# Patient Record
Sex: Male | Born: 1960
Health system: Southern US, Community
[De-identification: ages and names within clinical notes are randomized; demographics above are authoritative.]

## PROBLEM LIST (undated history)

## (undated) DIAGNOSIS — C61 Malignant neoplasm of prostate: Secondary | ICD-10-CM

## (undated) DIAGNOSIS — I639 Cerebral infarction, unspecified: Secondary | ICD-10-CM

## (undated) DIAGNOSIS — C679 Malignant neoplasm of bladder, unspecified: Secondary | ICD-10-CM

## (undated) DIAGNOSIS — Z8673 Personal history of transient ischemic attack (TIA), and cerebral infarction without residual deficits: Secondary | ICD-10-CM

## (undated) DIAGNOSIS — E119 Type 2 diabetes mellitus without complications: Secondary | ICD-10-CM

## (undated) DIAGNOSIS — Z720 Tobacco use: Secondary | ICD-10-CM

## (undated) DIAGNOSIS — I10 Essential (primary) hypertension: Secondary | ICD-10-CM

## (undated) HISTORY — PX: NO PAST SURGERIES: SHX2092

## (undated) HISTORY — DX: Personal history of transient ischemic attack (TIA), and cerebral infarction without residual deficits: Z86.73

## (undated) HISTORY — DX: Cerebral infarction, unspecified: I63.9

## (undated) HISTORY — DX: Malignant neoplasm of bladder, unspecified: C67.9

## (undated) HISTORY — DX: Type 2 diabetes mellitus without complications: E11.9

## (undated) HISTORY — DX: Essential (primary) hypertension: I10

## (undated) HISTORY — DX: Malignant neoplasm of prostate: C61

---
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
## 2017-03-10 ENCOUNTER — Encounter: Payer: Self-pay | Admitting: *Deleted

## 2017-03-11 ENCOUNTER — Encounter: Payer: Self-pay | Admitting: Cardiology

## 2017-03-11 ENCOUNTER — Telehealth: Payer: Self-pay | Admitting: Cardiology

## 2017-03-11 ENCOUNTER — Ambulatory Visit (INDEPENDENT_AMBULATORY_CARE_PROVIDER_SITE_OTHER): Payer: BLUE CROSS/BLUE SHIELD | Admitting: Cardiology

## 2017-03-11 ENCOUNTER — Encounter (INDEPENDENT_AMBULATORY_CARE_PROVIDER_SITE_OTHER): Payer: Self-pay

## 2017-03-11 VITALS — BP 138/78 | HR 105 | Ht 64.0 in | Wt 195.4 lb

## 2017-03-11 DIAGNOSIS — I10 Essential (primary) hypertension: Secondary | ICD-10-CM | POA: Diagnosis not present

## 2017-03-11 DIAGNOSIS — R55 Syncope and collapse: Secondary | ICD-10-CM | POA: Diagnosis not present

## 2017-03-11 DIAGNOSIS — R42 Dizziness and giddiness: Secondary | ICD-10-CM

## 2017-03-11 DIAGNOSIS — E1165 Type 2 diabetes mellitus with hyperglycemia: Secondary | ICD-10-CM

## 2017-03-11 NOTE — Telephone Encounter (Signed)
ECHO Scheduled at [facility] on Mar 15, 2017 arrive at 145pm

## 2017-03-11 NOTE — Progress Notes (Signed)
Cardiology Office Note  Date: 03/11/2017   ID: LAKEITH CAREAGA, DOB 1961-05-04, MRN [id number]  PCP: Monico Blitz, MD  Consulting Cardiologist: Rozann Lesches, MD   Chief Complaint  Patient presents with  . Dizziness    History of Present Illness: Eugene Harding is a 56 y.o. male referred for cardiology consultation by Dr. Manuella Ghazi for the assessment of dizziness. He is here today with his brother. He tells me that back in August he suddenly began to experience dizziness, specifically a feeling of lightheadedness as if he might pass out, but he does not have syncope. He also states that he has lost sensory sensation in his left hand. He states that this feeling of lightheadedness never goes away, is not any worse when he stands up or changes positions. He states that his vision is also worse, things look blurrier than they had been before. Patient's blood pressure was apparently substantially elevated when he first developed these symptoms, now with medication adjustments per Dr. Manuella Ghazi, his blood pressure has come down. He also has diabetes which is coming under somewhat better control.  Patient was seen in September by Dr. Inda Merlin for ENT consultation and was not felt to have true vertigo from an inner ear etiology. He also reportedly underwent a brain MRI and [facility], we are requesting these results. Also requesting lab results from [facility].  I personally reviewed his ECG today which shows sinus tachycardia.  We went over his current medications. He reports compliance.  Orthostatic measurements were made in the office today. Supine blood pressure 118/70 with heart rate 100, seated blood pressure 120/80 with heart rate 113, standing blood pressure 150/82 with heart rate 131, and standing blood pressure after 3 minutes 130/72 with heart rate 119.  He states that if it were not for these persistent symptoms discussed above, he would feel well. He does not report  any chest pain, no specific palpitations, no orthopnea or PND, no fevers or chills.  Past Medical History:  Diagnosis Date  . Essential hypertension   . Type 2 diabetes mellitus (HCC)     No reported surgeries.  Current Outpatient Prescriptions  Medication Sig Dispense Refill  . amLODipine (NORVASC) 10 MG tablet Take 10 mg by mouth daily.    Marland Kitchen aspirin EC 81 MG tablet Take 81 mg by mouth daily.    . dapagliflozin propanediol (FARXIGA) 5 MG TABS tablet Take 5 mg by mouth daily.    Marland Kitchen glucose blood test strip 1 each by Other route as needed for other. Use as instructed    . lisinopril (PRINIVIL,ZESTRIL) 40 MG tablet Take 40 mg by mouth daily.    . metFORMIN (GLUCOPHAGE) 500 MG tablet Take 500 mg by mouth 2 (two) times daily with a meal.     . ondansetron (ZOFRAN) 4 MG tablet Take 4 mg by mouth every 8 (eight) hours as needed for nausea or vomiting.     No current facility-administered medications for this visit.    Allergies:  Patient has no known allergies.   Social History: The patient  reports that he has been smoking Cigarettes.  He has never used smokeless tobacco.   Family History: The patient's family history includes Heart disease in his brother.   ROS:  Please see the history of present illness. Otherwise, complete review of systems is positive for none.  All other systems are reviewed and negative.   Physical Exam: VS:  BP 138/78   Pulse Marland Kitchen)  105   Ht 5\' 4"  (1.626 m)   Wt 195 lb 6.4 oz (88.6 kg)   SpO2 98%   BMI 33.54 kg/m , BMI Body mass index is 33.54 kg/m.  Wt Readings from Last 3 Encounters:  03/11/17 195 lb 6.4 oz (88.6 kg)  02/18/17 198 lb (89.8 kg)    General: Obese male, appears comfortable at rest. HEENT: Conjunctiva and lids normal, oropharynx clear. Neck: Supple, no elevated JVP or carotid bruits, no thyromegaly. Lungs: Clear to auscultation, nonlabored breathing at rest. Cardiac: Regular rate and rhythm, no S3 or significant systolic murmur, no  pericardial rub. Abdomen: Soft, nontender, bowel sounds present. Extremities: No pitting edema, distal pulses 2+. Skin: Warm and dry. Musculoskeletal: No kyphosis. Neuropsychiatric: Alert and oriented x3, affect grossly appropriate.  ECG: There is no old tracing available for comparison.  Recent Labwork:  No recent lab work available for review.  Other Studies Reviewed Today:  Chest x-ray 01/31/2017 ([place]): Cardiomegaly without acute coronary pulmonary disease.  Assessment and Plan:  1. Persistent lightheadedness since onset in August. No vertigo symptoms, no exacerbation of symptoms with position change, no orthostatic blood pressure drop today. He also reports concurrent change in visual acuity and sensory loss in his left hand increasing the likelihood of a neurological etiology. He is under the impression that the brain MRI done at [facility] was normal however. We are requesting these results. He also has an elevated resting heart rate in sinus rhythm at baseline. Could be increased due to autonomic dysfunction with diabetes mellitus, but would also want to exclude undiagnosed cardiomyopathy. We are requesting lab work obtained by Dr. Manuella Ghazi. Presumably he has been assessed for anemia and thyroid disease, etc. We will obtain an echocardiogram to assess cardiac structure and function. Not clear that a cardiac monitor is needed at this point based on description of symptoms. I recommended that he seek neurological consultation with [facility] and he plans to get this done through [facility].  2.  Essential hypertension, reportedly with very high blood pressures documented in August, coming under better control with medication adjustments per Dr. Manuella Ghazi. He is currently on Norvasc and lisinopril.  3. Uncontrolled type 2 diabetes mellitus, now on Farxiga and Glucophage and following with Dr. Manuella Ghazi.  Current medicines were reviewed with the  patient today.   Orders Placed This Encounter  Procedures  . EKG 12-Lead  . ECHOCARDIOGRAM COMPLETE    Disposition: Call with test results.  Signed, Satira Sark, MD, [facility] 03/11/2017 1:58 PM    [place] [facility] [place], [place], [place] [address] Phone: [phone]; Fax: [phone]

## 2017-03-11 NOTE — Patient Instructions (Signed)

## 2017-03-15 ENCOUNTER — Ambulatory Visit (HOSPITAL_COMMUNITY)
Admission: RE | Admit: 2017-03-15 | Discharge: 2017-03-15 | Disposition: A | Discharge status: Home / Self Care | Payer: BLUE CROSS/BLUE SHIELD | Source: Ambulatory Visit | Attending: Cardiology | Admitting: Cardiology

## 2017-03-15 DIAGNOSIS — E119 Type 2 diabetes mellitus without complications: Secondary | ICD-10-CM | POA: Insufficient documentation

## 2017-03-15 DIAGNOSIS — Z720 Tobacco use: Secondary | ICD-10-CM | POA: Insufficient documentation

## 2017-03-15 DIAGNOSIS — I119 Hypertensive heart disease without heart failure: Secondary | ICD-10-CM | POA: Diagnosis present

## 2017-03-15 DIAGNOSIS — R55 Syncope and collapse: Secondary | ICD-10-CM

## 2017-03-15 DIAGNOSIS — I080 Rheumatic disorders of both mitral and aortic valves: Secondary | ICD-10-CM | POA: Diagnosis not present

## 2017-03-15 NOTE — Progress Notes (Signed)
*  PRELIMINARY RESULTS* Echocardiogram 2D Echocardiogram has been performed.  Leavy Cella 03/15/2017, 2:49 PM

## 2017-03-16 ENCOUNTER — Telehealth: Payer: Self-pay

## 2017-03-16 NOTE — Telephone Encounter (Signed)
-----   Message from Merlene Laughter, LPN sent at [id number]/8616  7:17 AM EDT -----   ----- Message ----- From: Satira Sark, MD Sent: 03/15/2017   4:15 PM To: Merlene Laughter, LPN  Results reviewed. No evidence of cardiomyopathy, LVEF normal at 55-60%. No major valvular abnormalities. No further cardiac workup planned at this time - please see recent office note. A copy of this test should be forwarded to Monico Blitz, MD.

## 2017-03-16 NOTE — Telephone Encounter (Signed)
LMTCB

## 2017-03-16 NOTE — Telephone Encounter (Signed)
Patient returned call  Please call him back on house phone [phone]

## 2017-03-16 NOTE — Telephone Encounter (Signed)
Patient notified. Routed to PCP 

## 2017-03-25 ENCOUNTER — Encounter: Payer: Self-pay | Admitting: Neurology

## 2017-03-25 ENCOUNTER — Telehealth: Payer: Self-pay | Admitting: Neurology

## 2017-03-25 ENCOUNTER — Ambulatory Visit (INDEPENDENT_AMBULATORY_CARE_PROVIDER_SITE_OTHER): Payer: BLUE CROSS/BLUE SHIELD | Admitting: Neurology

## 2017-03-25 VITALS — BP 137/77 | HR 100 | Ht 64.0 in | Wt 194.0 lb

## 2017-03-25 DIAGNOSIS — I10 Essential (primary) hypertension: Secondary | ICD-10-CM

## 2017-03-25 DIAGNOSIS — R29898 Other symptoms and signs involving the musculoskeletal system: Secondary | ICD-10-CM | POA: Diagnosis not present

## 2017-03-25 DIAGNOSIS — I639 Cerebral infarction, unspecified: Secondary | ICD-10-CM

## 2017-03-25 DIAGNOSIS — R42 Dizziness and giddiness: Secondary | ICD-10-CM

## 2017-03-25 DIAGNOSIS — R200 Anesthesia of skin: Secondary | ICD-10-CM | POA: Diagnosis not present

## 2017-03-25 DIAGNOSIS — R29818 Other symptoms and signs involving the nervous system: Secondary | ICD-10-CM

## 2017-03-25 MED ORDER — ONDANSETRON 4 MG PO TBDP: 4.0000 mg | ORAL_TABLET | Freq: Three times a day (TID) | ORAL | 6 refills | Status: DC | PRN

## 2017-03-25 NOTE — Progress Notes (Addendum)
[facility]    Provider:  Dr Jaynee Eagles Referring Provider: Monico Blitz, MD Primary Care Physician:  Monico Blitz, MD  CC:  Dizziness  HPI:  Eugene Harding is a 56 y.o. male here as a referral from Dr. Manuella Ghazi for dizziness. This patient has not taken care of himself in years, is obese, no exercise, hasn't seen a doctor in years, smoker so did not discover he had HTN and diabetes until he had acute onset stroke.  The patient has uncontrolled hypertension, uncontrolled diabetes and a past medical history of vertigo. Here for dizziness, has been suddenly has been occurring in a persistent pattern for weeks. The courses been increasing, he also is a current smoker, obesity. He is on meclizine for dizziness. He had vertigo about 7 years ago. Started in August with a new diagnosis of diabetes, unsure how long he has been diabetic never went to a doctor. He started getting numb in his hand on the left. It was acute, end of August. Then he started getting dizzy, blurry in his eyes, he has dizziness, no room spinning, feels like he is going to pass out. Some days it has been worse. Nt better. He has not been to vestibular therapy. He has seen ENT. He has also seen. He went to urgent care and his BP was over 200. He went to [place], they did not admit him. That is when he was diagnosed with HTN and diabetes. He went to cardiology as well. No other focal neurologic deficits, associated symptoms, inciting events or modifiable factors.  Reviewed notes, labs and imaging from outside physicians, which showed:  Patient has uncontrolled hypertension and diabetes. Onset has been sudden and has been occurring in a persistent pattern for weeks. The courses been increasing. The dizziness is characterized as lightheadedness and off balance. Symptoms have been associated with diabetes, while the symptoms have not been associated with elevated blood pressure headache. He is taking meclizine. Has been to ENT.  Has already had an MRI.   Echocardiogram of the heart March 15 2017:  - Mild LVH with LVEF 55-60% and grade 1 diastolic dysfunction.   Mildly calcified mitral annulus with trivial mitral   regurgitation. Mildly calcified aortic valve. Trivial tricuspid   regurgitation.  MRI of the brain 03/12/2017 showed a left cerebellar infarct which is likely chronic.  CMP with increased glucose 280, hemoglobin A1c 8.8, BUN 17 and creatinine 0.86 collected 01/31/2017.  Review of Systems: Patient complains of symptoms per HPI as well as the following symptoms: Blurred vision, numbness, dizziness, vertigo 7 years ago, high blood pressure and "sugar".  Pertinent negatives and positives per HPI. All others negative.   Social History   Social History  . Marital status: Married    Spouse name: N/A  . Number of children: N/A  . Years of education: N/A   Occupational History  . Not on file.   Social History Main Topics  . Smoking status: Current Every Day Smoker    Packs/day: 1.00    Types: Cigarettes  . Smokeless tobacco: Never Used  . Alcohol use Yes     Comment: occaisonally  . Drug use: No  . Sexual activity: Not on file   Other Topics Concern  . Not on file   Social History Narrative  . No narrative on file    Family History  Problem Relation Age of Onset  . Heart disease Brother     Past Medical History:  Diagnosis Date  . Essential hypertension   .  Type 2 diabetes mellitus ([place])     Past Surgical History:  Procedure Laterality Date  . NO PAST SURGERIES      Current Outpatient Prescriptions  Medication Sig Dispense Refill  . amLODipine (NORVASC) 10 MG tablet Take 10 mg by mouth daily.    Marland Kitchen aspirin EC 325 MG EC tablet Take 1 tablet (325 mg total) by mouth daily. (Patient not taking: Reported on 04/02/2017) 30 tablet 1  . Blood Glucose Monitoring Suppl (ONETOUCH VERIO) w/Device KIT USE TO TEST BLOOD SUGAR  3  . dapagliflozin propanediol (FARXIGA) 5 MG TABS tablet  Take 5 mg by mouth daily.    Marland Kitchen glucose blood test strip 1 each by Other route as needed for other. Use as instructed    . lisinopril (PRINIVIL,ZESTRIL) 40 MG tablet Take 40 mg by mouth daily.    . metFORMIN (GLUCOPHAGE) 500 MG tablet Take 500 mg by mouth 2 (two) times daily with a meal.     . ONETOUCH DELICA LANCETS FINE MISC USE TO TEST BLOOD SUGAR ONCE DAILY  2  . aspirin 81 MG chewable tablet Chew 81 mg by mouth daily.    . ondansetron (ZOFRAN ODT) 4 MG disintegrating tablet Take 1 tablet (4 mg total) by mouth every 8 (eight) hours as needed for nausea or vomiting. Or dizziness. 60 tablet 6   No current facility-administered medications for this visit.     Allergies as of 03/25/2017  . (No Known Allergies)    Vitals: BP 137/77 (Patient Position: Supine)   Pulse 100   Ht 5' 4"  (1.626 m)   Wt 194 lb (88 kg)   BMI 33.30 kg/m  Last Weight:  Wt Readings from Last 1 Encounters:  03/25/17 194 lb (88 kg)   Last Height:   Ht Readings from Last 1 Encounters:  03/25/17 5' 4"  (1.626 m)   Physical exam: Exam: Gen: NAD, conversant, well nourised, obese, well groomed                     CV: RRR, no MRG. No Carotid Bruits. No peripheral edema, warm, nontender Eyes: Conjunctivae clear without exudates or hemorrhage  Neuro: Detailed Neurologic Exam  Speech:    Speech is normal; fluent and spontaneous with normal comprehension.  Cognition:    The patient is oriented to person, place, and time;     recent and remote memory intact;     language fluent;     normal attention, concentration,     fund of knowledge Cranial Nerves:    The pupils are equal, round, and reactive to light. Attempted funduscopic exam could not visualize Visual fields are full to finger confrontation. Extraocular movements are intact. Trigeminal sensation is intact and the muscles of mastication are normal. The face is symmetric. The palate elevates in the midline. Hearing intact. Voice is normal. Shoulder shrug is  normal. The tongue has normal motion without fasciculations.   Coordination:    Decreased fine motor movements left hand  Gait:    Slightly wide-based due to large body habitus  Motor Observation:    No asymmetry, no atrophy, and no involuntary movements noted. Tone:    Normal muscle tone.    Posture:    Posture is normal. normal erect    Strength:    normal     Sensation: intact to LT     Reflex Exam:  DTR's:    Deep tendon reflexes in the upper and lower extremities are symmetrical bilaterally.  Toes:    The toes are downgoing bilaterally.   Clonus:    Clonus is absent.       Assessment/Plan:  Acute onset left hand numbness, dizziness and vertigo likely small vessel stroke in a patient who is obese with uncontrolled diabetes, uncontrolled hypertension, head never been to a doctor.   - This patient has not taken care of himself, is obese, no exercise, smoker, hasn't seen a doctor in years so did not discover he had uncontrolled HTN and uncontrolled diabetes until he had acute onset neurologic symptoms a few months ago  He says he is not getting anywhere with doctors helping him (despite excellent treatment from the medical community); I did discuss taking personal responsibility for his own health and that his actions, not physicians, are the root of his medical conditions.  - MRI brain w/wo contrast to evaluate for strokes, schwannoma or other etiology of symptoms. Patient had acute onset dizziness, left hand numbness and decreased fine motor abilities. Likely a small vessel brainstem stroke that was not seen on MRI of the brain, we'll repeat an also include contrast which was not included on the first MRI to evaluate for other etiologies such as schwannoma. - Physical Therapy for vestibular therapy and occupational therapy for decreased fine motor movements left hand - MRA head to evaluate for cerebrovascular disease and stroke - Need cholesterol check and started on  lipid, patient has not fasted today we'll recommend this from primary care, will CC primary care and asked patient to discuss this with them, LDL goal less than 70. - ESS 2 not suspected sleep apnea, will monitor and discussed with patient that this can be a risk factor for stroke. - Also recommend carotid evaluation, will defer to PCP as patient thinks he is are he had this completed and declined but I don't have any records of this test. However strokes likely small vessel disease due to uncontrolled risk factors as opposed to carotid stenosis. Discussed with patient and his brother, he should have carotid studies done, I will CC this note to his primary care he needs to initiate the conversation and ensure this was completed with primary care.  Addendum 04/02/2017: Called to try to discuss MRI of the brain  which was completed last month at [facility] [place](Prior to his first appointment with me). MRI of the brain has been resulted last month already and patient knows the results, I had asked for the CD of imaging to compare it to a repeat MRI  (which is ordered but has not been completed yet).  Patient with acute onset left hand numbness and dizziness. MRI at [facility] showed 3 chronic strokes. I suspect he had an acute brainstem stroke too small to be seen on MRI brain.  He has 3 chronic lacunar infarcts and I don't think any one of them would explain all his symptoms but he does have a right thalamic stroke (numbness in left hand) and a cerebellar stroke (dizziness) but these would have to happen at the same time and they look ischemic not embolic.   Patient is in in the ED currently. discussed with emergency room doctor I would like patient to be considered for admission in order to have the stroke team evaluate him in the morning. Spoke to neuro hospitalist for evaluation based on neural hospitalist's clinical evaluation on whether admission is appropriate and, if so, MRI of the brain with and  without contrast would be helpful. Also needs carotid dopplers, don't think  he had that just an echo.   Orders Placed This Encounter  Procedures  . MR BRAIN WO CONTRAST  . MR MRA HEAD WO CONTRAST  . Ambulatory referral to Physical Therapy  . Ambulatory referral to Occupational Therapy   I had a long d/w patient about stroke, risk for recurrent stroke/TIAs, personally independently reviewed imaging studies and stroke evaluation results and answered questions. Increase to ASA 334mfor secondary stroke prevention (literature shows that aspirin 325 maybe more neuroprotective and 81) and maintain strict control of hypertension with blood pressure goal below 130/90, diabetes with hemoglobin A1c goal below 6.5% and lipids with LDL cholesterol goal below 70 mg/dL. I also advised the patient to eat a healthy diet with plenty of whole grains, cereals, fruits and vegetables, exercise regularly and maintain ideal body weight .  Cc: Dr. [facility]gnacia Bayley [facility] [address] [address] Phone [phone] [phone]

## 2017-03-25 NOTE — Patient Instructions (Signed)
MRI brain, MRA head, Will review the MRI on disk, Will review Labs and may need to check into cholesterol panel and carotid evaluation. Physical therapy and Occupational Therapy.    Ischemic Stroke An ischemic stroke (cerebrovascular accident, or CVA) is the sudden death of brain tissue that occurs when an area of the brain does not get enough oxygen. It is a medical emergency that must be treated right away. An ischemic stroke can cause permanent loss of brain function. This can cause problems with how different parts of your body function. What are the causes? This condition is caused by a decrease of oxygen supply to an area of the brain, which may be the result of:  A small blood clot (embolus) or a buildup of plaque in the blood vessels (atherosclerosis) that blocks blood flow in the brain.  An abnormal heart rhythm (atrial fibrillation).  A blocked or damaged artery in the head or neck.  What increases the risk? Certain factors may make you more likely to develop this condition. Some of these factors are things that you can change, such as:  Obesity.  Smoking cigarettes.  Taking oral birth control, especially if you also use tobacco.  Physical inactivity.  Excessive alcohol use.  Use of illegal drugs, especially cocaine and methamphetamine.  Other risk factors include:  High blood pressure (hypertension).  High cholesterol.  Diabetes mellitus.  Heart disease.  Being [place] American, Native American, Hispanic, or [place] Native.  Being over age 11.  Family history of stroke.  Previous history of blood clots, stroke, or transient ischemic attack (TIA).  Sickle cell disease.  Being a woman with a history of preeclampsia.  Migraine headache.  Sleep apnea.  Irregular heartbeats, such as atrial fibrillation.  Chronic inflammatory diseases, such as rheumatoid arthritis or lupus.  Blood clotting disorders (hypercoagulable state).  What are the signs or  symptoms? Symptoms of this condition usually develop suddenly, or you may notice them after waking up from sleep. Symptoms may include sudden:  Weakness or numbness in your face, arm, or leg, especially on one side of your body.  Trouble walking or difficulty moving your arms or legs.  Loss of balance or coordination.  Confusion.  Slurred speech (dysarthria).  Trouble speaking, understanding speech, or both (aphasia).  Vision changes-such as double vision, blurred vision, or loss of vision-inone or both eyes.  Dizziness.  Nausea and vomiting.  Severe headache with no known cause. The headache is often described as the worst headache ever experienced.  If possible, make note of the exact time that you last felt like your normal self and what time your symptoms started. Tell your health care provider. If symptoms come and go, this could be a sign of a warning stroke, or TIA. Get help right away, even if you feel better. How is this diagnosed? This condition may be diagnosed based on:  Your symptoms, your medical history, and a physical exam.  CT scan of the brain.  MRI.  CT angiogram. This test uses a computer to take X-rays of your arteries. A dye may be injected into your blood to show the inside of your blood vessels more clearly.  MRI angiogram. This is a type of MRI that is used to evaluate the blood vessels.  Cerebral angiogram. This test uses X-rays and a dye to show the blood vessels in the brain and neck.  You may need to see a health care provider who specializes in stroke care. A stroke specialist can be  seen in person or through communication using telephone or television technology (telemedicine). Other tests may also be done to find the cause of the stroke, such as:  Electrocardiogram (ECG).  Continuous heart monitoring.  Echocardiogram.  Carotid ultrasound.  A scan of the brain circulation.  Blood tests.  Sleep study to check for sleep  apnea.  How is this treated? Treatment for this condition will depend on the duration, severity, and cause of your symptoms and on the area of the brain affected. It is very important to get treatment at the first sign of stroke symptoms. Some treatments work better if they are done within 3-6 hours of the onset of stroke symptoms. These initial treatments may include:  Aspirin.  Medicines to control blood pressure.  Medicine given by injection to dissolve the blood clot (thrombolytic).  Treatments given directly to the affected artery to remove or dissolve the blood clot.  Other treatment options may include:  Oxygen.  IV fluids.  Medicines to thin the blood (anticoagulants or antiplatelets).  Procedures to increase blood flow.  Medicines and changes to your diet may be used to help treat and manage risk factors for stroke, such as diabetes, high cholesterol, and high blood pressure. After a stroke, you may work with physical, speech, mental health, or occupational therapists to help you recover. Follow these instructions at home: Medicines  Take over-the-counter and prescription medicines only as told by your health care provider.  If you were told to take a medicine to thin your blood, such as aspirin or an anticoagulant, take it exactly as told by your health care provider. ? Taking too much blood-thinning medicine can cause bleeding. ? If you do not take enough blood-thinning medicine, you will not have the protection that you need against another stroke and other problems.  Understand the side effects of taking anticoagulant medicine. When taking this type of medicine, make sure you: ? Hold pressure over any cuts for longer than usual. ? Tell your dentist and other health care providers that you are taking anticoagulants before you have any procedures that may cause bleeding. ? Avoid activities that may cause trauma or injury. Eating and drinking  Follow instructions  from your health care provider about diet.  Eat healthy foods.  If your ability to swallow was affected by the stroke, you may need to take steps to avoid choking, such as: ? Taking small bites when eating. ? Eating foods that are soft or pureed. Safety  Follow instructions from your health care team about physical activity.  Use a walker or cane as told by your health care provider.  Take steps to create a safe home environment in order to reduce the risk of falls. This may include: ? Having your home looked at by specialists. ? Installing grab bars in the bedroom and bathroom. ? Using safety equipment, such as raised toilets and a seat in the shower. General instructions  Do not use any tobacco products, such as cigarettes, chewing tobacco, and e-cigarettes. If you need help quitting, ask your health care provider.  Limit alcohol intake to no more than 1 drink a day for nonpregnant women and 2 drinks a day for men. One drink equals 12 oz of beer, 5 oz of wine, or 1 oz of hard liquor.  If you need help to stop using drugs or alcohol, ask your health care provider about a referral to a program or specialist.  Maintain an active and healthy lifestyle. Get regular exercise as  told by your health care provider.  Keep all follow-up visits as told by your health care provider, including visits with all specialists on your health care team. This is important. How is this prevented? Your risk of another stroke can be decreased by managing high blood pressure, high cholesterol, diabetes, heart disease, sleep apnea, and obesity. It can also be decreased by quitting smoking, limiting alcohol, and staying physically active. Your health care provider will continue to work with you on measures to prevent short-term and long-term complications of stroke. Get help right away if: You have:  Sudden weakness or numbness in your face, arm, or leg, especially on one side of your body.  Sudden  confusion.  Sudden trouble speaking, understanding, or both (aphasia).  Sudden trouble seeing with one or both eyes.  Sudden trouble walking or difficulty moving your arms or legs.  Sudden dizziness.  Sudden loss of balance or coordination.  Sudden, severe headache with no known cause.  A partial or total loss of consciousness.  A seizure. Any of these symptoms may represent a serious problem that is an emergency. Do not wait to see if the symptoms will go away. Get medical help right away. Call your local emergency services (911 in [place] Do not drive yourself to the hospital. This information is not intended to replace advice given to you by your health care provider. Make sure you discuss any questions you have with your health care provider. Document Released: 05/18/2005 Document Revised: 10/29/2015 Document Reviewed: 08/14/2015 Elsevier Interactive Patient Education  2017 [employer].

## 2017-03-25 NOTE — Telephone Encounter (Signed)
I have reached out to the patients PCP to see if patient has had recent lab work completed. LVM with Dr Trena Platt nurse Vicente Males for a call back with the information. Dr Jaynee Eagles is specifically asking if he has had his cholesterol checked.

## 2017-03-25 NOTE — Telephone Encounter (Signed)
Patient's PCP's office called back and stated that he did have labs in September and is faxing them over now.

## 2017-03-29 ENCOUNTER — Encounter: Payer: Self-pay | Admitting: Neurology

## 2017-03-29 DIAGNOSIS — IMO0002 Reserved for concepts with insufficient information to code with codable children: Secondary | ICD-10-CM | POA: Insufficient documentation

## 2017-03-29 DIAGNOSIS — I639 Cerebral infarction, unspecified: Secondary | ICD-10-CM | POA: Insufficient documentation

## 2017-03-29 DIAGNOSIS — I10 Essential (primary) hypertension: Secondary | ICD-10-CM | POA: Insufficient documentation

## 2017-03-29 DIAGNOSIS — E1165 Type 2 diabetes mellitus with hyperglycemia: Secondary | ICD-10-CM | POA: Insufficient documentation

## 2017-03-29 MED ORDER — ASPIRIN 325 MG PO TBEC: 81.0000 mg | DELAYED_RELEASE_TABLET | Freq: Every day | ORAL | 1 refills | Status: DC

## 2017-04-01 ENCOUNTER — Telehealth: Payer: Self-pay | Admitting: Neurology

## 2017-04-01 NOTE — Telephone Encounter (Signed)
Pt states that Dr Jaynee Eagles kept the disc of his MRI but he has not been notified of any results.  He states today makes it a week.  Pt is asking for a call with results of MRI

## 2017-04-01 NOTE — Telephone Encounter (Signed)
MRI given to Dr. Jaynee Eagles for review. Will call patient as soon as result comments from MD are available.

## 2017-04-01 NOTE — Telephone Encounter (Signed)
MRI of the brain showed nothing acute however it did show several (3) old small (lacunar) strokes (>75 month old). These strokes were caused by patient's past medical history of high blood pressure and uncontrolled diabetes as well as smoking.  I cannot tell how old the stroke are or if any of them coincided with his symptoms thus unclear if one of these is the cause of his symptoms. The best thing he can do his manage all these risk factors, take aspirin 325 daily, and participate in physical therapy. Thanks.

## 2017-04-01 NOTE — Telephone Encounter (Signed)
Pt called back dizziness and eye sight is getting worse. He is anxious to hear back from MD.

## 2017-04-02 ENCOUNTER — Telehealth: Payer: Self-pay | Admitting: Neurology

## 2017-04-02 ENCOUNTER — Encounter (HOSPITAL_COMMUNITY): Payer: Self-pay | Admitting: Emergency Medicine

## 2017-04-02 ENCOUNTER — Observation Stay (HOSPITAL_COMMUNITY)
Admission: EM | Admit: 2017-04-02 | Discharge: 2017-04-03 | Disposition: A | Discharge status: Home / Self Care | Payer: BLUE CROSS/BLUE SHIELD | Attending: Family Medicine | Admitting: Family Medicine

## 2017-04-02 ENCOUNTER — Emergency Department (HOSPITAL_COMMUNITY): Payer: BLUE CROSS/BLUE SHIELD

## 2017-04-02 DIAGNOSIS — G5612 Other lesions of median nerve, left upper limb: Secondary | ICD-10-CM | POA: Diagnosis not present

## 2017-04-02 DIAGNOSIS — R42 Dizziness and giddiness: Secondary | ICD-10-CM | POA: Diagnosis not present

## 2017-04-02 DIAGNOSIS — Z7984 Long term (current) use of oral hypoglycemic drugs: Secondary | ICD-10-CM | POA: Diagnosis not present

## 2017-04-02 DIAGNOSIS — R200 Anesthesia of skin: Secondary | ICD-10-CM | POA: Diagnosis not present

## 2017-04-02 DIAGNOSIS — D696 Thrombocytopenia, unspecified: Secondary | ICD-10-CM | POA: Diagnosis present

## 2017-04-02 DIAGNOSIS — R202 Paresthesia of skin: Secondary | ICD-10-CM

## 2017-04-02 DIAGNOSIS — Z79899 Other long term (current) drug therapy: Secondary | ICD-10-CM | POA: Insufficient documentation

## 2017-04-02 DIAGNOSIS — I10 Essential (primary) hypertension: Secondary | ICD-10-CM | POA: Diagnosis not present

## 2017-04-02 DIAGNOSIS — Z7982 Long term (current) use of aspirin: Secondary | ICD-10-CM | POA: Diagnosis not present

## 2017-04-02 DIAGNOSIS — R52 Pain, unspecified: Secondary | ICD-10-CM

## 2017-04-02 DIAGNOSIS — I639 Cerebral infarction, unspecified: Principal | ICD-10-CM | POA: Diagnosis present

## 2017-04-02 DIAGNOSIS — IMO0002 Reserved for concepts with insufficient information to code with codable children: Secondary | ICD-10-CM | POA: Diagnosis present

## 2017-04-02 DIAGNOSIS — E1165 Type 2 diabetes mellitus with hyperglycemia: Secondary | ICD-10-CM

## 2017-04-02 DIAGNOSIS — F17210 Nicotine dependence, cigarettes, uncomplicated: Secondary | ICD-10-CM | POA: Diagnosis not present

## 2017-04-02 DIAGNOSIS — E119 Type 2 diabetes mellitus without complications: Secondary | ICD-10-CM | POA: Diagnosis not present

## 2017-04-02 DIAGNOSIS — Z720 Tobacco use: Secondary | ICD-10-CM | POA: Diagnosis not present

## 2017-04-02 DIAGNOSIS — G4733 Obstructive sleep apnea (adult) (pediatric): Secondary | ICD-10-CM

## 2017-04-02 HISTORY — DX: Tobacco use: Z72.0

## 2017-04-02 LAB — DIFFERENTIAL
BASOS PCT: 1 %
Basophils Absolute: 0 10*3/uL (ref 0.0–0.1)
EOS ABS: 0.5 10*3/uL (ref 0.0–0.7)
EOS PCT: 6 %
Lymphocytes Relative: 28 %
Lymphs Abs: 2.4 10*3/uL (ref 0.7–4.0)
Monocytes Absolute: 0.6 10*3/uL (ref 0.1–1.0)
Monocytes Relative: 7 %
Neutro Abs: 5 10*3/uL (ref 1.7–7.7)
Neutrophils Relative %: 59 %

## 2017-04-02 LAB — LIPID PANEL
CHOL/HDL RATIO: 5.3 ratio
CHOLESTEROL: 168 mg/dL (ref 0–200)
HDL: 32 mg/dL — ABNORMAL LOW (ref >40)
LDL Cholesterol: 84 mg/dL (ref 0–99)
Triglycerides: 260 mg/dL — ABNORMAL HIGH (ref <150)
VLDL: 52 mg/dL — ABNORMAL HIGH (ref 0–40)

## 2017-04-02 LAB — CBC
HCT: 46.9 % (ref 39.0–52.0)
Hemoglobin: 15.9 g/dL (ref 13.0–17.0)
MCH: 32.3 pg (ref 26.0–34.0)
MCHC: 33.9 g/dL (ref 30.0–36.0)
MCV: 95.3 fL (ref 78.0–100.0)
PLATELETS: 102 10*3/uL — AB (ref 150–400)
RBC: 4.92 MIL/uL (ref 4.22–5.81)
RDW: 12.9 % (ref 11.5–15.5)
WBC: 8.5 10*3/uL (ref 4.0–10.5)

## 2017-04-02 LAB — I-STAT CHEM 8, ED
BUN: 18 mg/dL (ref 6–20)
Calcium, Ion: 1.13 mmol/L — ABNORMAL LOW (ref 1.15–1.40)
Chloride: 105 mmol/L (ref 101–111)
Creatinine, Ser: 1 mg/dL (ref 0.61–1.24)
Glucose, Bld: 149 mg/dL — ABNORMAL HIGH (ref 65–99)
HEMATOCRIT: 47 % (ref 39.0–52.0)
Hemoglobin: 16 g/dL (ref 13.0–17.0)
Potassium: 4.4 mmol/L (ref 3.5–5.1)
SODIUM: 141 mmol/L (ref 135–145)
TCO2: 28 mmol/L (ref 22–32)

## 2017-04-02 LAB — COMPREHENSIVE METABOLIC PANEL
ALBUMIN: 4 g/dL (ref 3.5–5.0)
ALT: 41 U/L (ref 17–63)
ANION GAP: 9 (ref 5–15)
AST: 33 U/L (ref 15–41)
Alkaline Phosphatase: 76 U/L (ref 38–126)
BILIRUBIN TOTAL: 1.6 mg/dL — AB (ref 0.3–1.2)
BUN: 17 mg/dL (ref 6–20)
CHLORIDE: 107 mmol/L (ref 101–111)
CO2: 23 mmol/L (ref 22–32)
Calcium: 9.3 mg/dL (ref 8.9–10.3)
Creatinine, Ser: 1.05 mg/dL (ref 0.61–1.24)
GFR calc Af Amer: >60 mL/min (ref >60)
GFR calc non Af Amer: >60 mL/min (ref >60)
GLUCOSE: 153 mg/dL — AB (ref 65–99)
POTASSIUM: 5 mmol/L (ref 3.5–5.1)
SODIUM: 139 mmol/L (ref 135–145)
TOTAL PROTEIN: 6.5 g/dL (ref 6.5–8.1)

## 2017-04-02 LAB — SAVE SMEAR

## 2017-04-02 LAB — LACTATE DEHYDROGENASE: LDH: 161 U/L (ref 98–192)

## 2017-04-02 LAB — I-STAT TROPONIN, ED: Troponin i, poc: 0.01 ng/mL (ref 0.00–0.08)

## 2017-04-02 LAB — PROTIME-INR
INR: 0.96
PROTHROMBIN TIME: 12.7 s (ref 11.4–15.2)

## 2017-04-02 LAB — APTT: APTT: 26 s (ref 24–36)

## 2017-04-02 LAB — HEMOGLOBIN A1C
Hgb A1c MFr Bld: 8.1 % — ABNORMAL HIGH (ref 4.8–5.6)
MEAN PLASMA GLUCOSE: 185.77 mg/dL

## 2017-04-02 LAB — GLUCOSE, CAPILLARY: Glucose-Capillary: 206 mg/dL — ABNORMAL HIGH (ref 65–99)

## 2017-04-02 MED ORDER — LISINOPRIL 20 MG PO TABS
40.0000 mg | ORAL_TABLET | Freq: Every day | ORAL | Status: DC
  Administered 2017-04-02 – 2017-04-03 (×2): 40 mg via ORAL
  Filled 2017-04-02 (×2): qty 2

## 2017-04-02 MED ORDER — STROKE: EARLY STAGES OF RECOVERY BOOK
Freq: Once | Status: AC
  Administered 2017-04-02: 23:00:00
  Filled 2017-04-02: qty 1

## 2017-04-02 MED ORDER — INSULIN ASPART 100 UNIT/ML ~~LOC~~ SOLN
0.0000 [IU] | Freq: Every day | SUBCUTANEOUS | Status: DC
  Administered 2017-04-02: 2 [IU] via SUBCUTANEOUS

## 2017-04-02 MED ORDER — ACETAMINOPHEN 160 MG/5ML PO SOLN: 650.0000 mg | ORAL | Status: DC | PRN

## 2017-04-02 MED ORDER — SODIUM CHLORIDE 0.9 % IV SOLN
INTRAVENOUS | Status: DC
  Administered 2017-04-02: 23:00:00 via INTRAVENOUS

## 2017-04-02 MED ORDER — ASPIRIN 325 MG PO TABS
325.0000 mg | ORAL_TABLET | Freq: Every day | ORAL | Status: DC
  Administered 2017-04-02 – 2017-04-03 (×2): 325 mg via ORAL
  Filled 2017-04-02 (×2): qty 1

## 2017-04-02 MED ORDER — AMLODIPINE BESYLATE 10 MG PO TABS
10.0000 mg | ORAL_TABLET | Freq: Every day | ORAL | Status: DC
  Administered 2017-04-02 – 2017-04-03 (×2): 10 mg via ORAL
  Filled 2017-04-02: qty 1
  Filled 2017-04-02: qty 2

## 2017-04-02 MED ORDER — GADOBENATE DIMEGLUMINE 529 MG/ML IV SOLN
18.0000 mL | Freq: Once | INTRAVENOUS | Status: AC | PRN
  Administered 2017-04-02: 18 mL via INTRAVENOUS

## 2017-04-02 MED ORDER — ASPIRIN 300 MG RE SUPP: 300.0000 mg | Freq: Every day | RECTAL | Status: DC

## 2017-04-02 MED ORDER — SENNOSIDES-DOCUSATE SODIUM 8.6-50 MG PO TABS: 1.0000 | ORAL_TABLET | Freq: Every evening | ORAL | Status: DC | PRN

## 2017-04-02 MED ORDER — INSULIN ASPART 100 UNIT/ML ~~LOC~~ SOLN
0.0000 [IU] | Freq: Three times a day (TID) | SUBCUTANEOUS | Status: DC
  Administered 2017-04-03 (×2): 2 [IU] via SUBCUTANEOUS

## 2017-04-02 MED ORDER — ONDANSETRON HCL 4 MG/2ML IJ SOLN: 4.0000 mg | Freq: Three times a day (TID) | INTRAMUSCULAR | Status: DC | PRN

## 2017-04-02 MED ORDER — ACETAMINOPHEN 325 MG PO TABS: 650.0000 mg | ORAL_TABLET | ORAL | Status: DC | PRN

## 2017-04-02 MED ORDER — ACETAMINOPHEN 650 MG RE SUPP: 650.0000 mg | RECTAL | Status: DC | PRN

## 2017-04-02 MED ORDER — ATORVASTATIN CALCIUM 40 MG PO TABS
40.0000 mg | ORAL_TABLET | Freq: Every day | ORAL | Status: DC
  Administered 2017-04-02: 40 mg via ORAL
  Filled 2017-04-02: qty 1

## 2017-04-02 MED ORDER — HYDRALAZINE HCL 20 MG/ML IJ SOLN: 5.0000 mg | INTRAMUSCULAR | Status: DC | PRN

## 2017-04-02 MED ORDER — ZOLPIDEM TARTRATE 5 MG PO TABS: 5.0000 mg | ORAL_TABLET | Freq: Every evening | ORAL | Status: DC | PRN

## 2017-04-02 NOTE — ED Triage Notes (Signed)
Pt to ER for persistent, worsening dizziness and left arm numbness for 3 months. States "was told last week I had had a stroke that no none caught." states no symptoms. A/o x4. Weakness noted to left grip, states this has been going on as well for 3 months.

## 2017-04-02 NOTE — ED Notes (Signed)
Per MRI, pt to be transported to scan in approx 3-3.5 hours. Pt notified of delay. Pt verbalized understanding. Will continue to round.

## 2017-04-02 NOTE — ED Notes (Signed)
Pt returned from MRI, nad at this time 

## 2017-04-02 NOTE — Telephone Encounter (Signed)
Called to try to discuss MRI of the brain  which was completed last month at [facility] [place](Prior to his first appointment with me). MRI of the brain has been resulted last month already and patient knows the results, I had asked for the CD of imaging to compare it to a repeat MRI  (which is ordered but has not been completed yet).  Patient with acute onset left hand numbness and dizziness. MRI at [facility] showed 3 chronic strokes. I suspect he had a brainstem stroke too small to be seen on MRI brain.  He has 3 chronic lacunar infarcts and I don't think any one of them would explain all his symptoms but he does have a right thalamic stroke (numbness in left hand) and a cerebellar stroke (dizziness) but these would have to happen at the same time and they look ischemic not embolic.   Patient is in in the ED currently. discussed with emergency room doctor I would like patient to be considered for admission in order to have the stroke team evaluate him in the morning. Spoke to neuro hospitalist for evaluation based on neural hospitalist's clinical evaluation on whether admission is appropriate and, if so, MRI of the brain with and without contrast would be helpful.

## 2017-04-02 NOTE — H&P (Signed)
History and Physical    Eugene Harding HKV:[id number] DOB: 12-27-1960 DOA: 04/02/2017  Referring MD/NP/PA:   PCP: Monico Blitz, MD   Patient coming from:  The patient is coming from home.  At baseline, pt is independent for most of ADL.   Chief Complaint: Dizziness, left arm numbness  HPI: Eugene Harding is a 56 y.o. male with medical history significant of hypertension, diabetes mellitus, tobacco abuse, who presents with dizziness and left arm numbness.  Patient states that he has been having dizziness and left arm numbness for about 3 months. Symptoms have worsened in the past 4 days. He also has intermittent bilateral blurry vision, no unilateral weakness, hearing loss, slurred speech or dysphagia.  His numbness is mainly in the first 3 fingers of the left hand. Pt had MRI at [facility] showed 3 chronic strokes.. Patient does not have chest pain, shortness breath, cough, fever or chills. No GI symptoms. Denies symptoms of UTI.  ED Course: pt was found to have WBC 8.5, INR 0.6, negative troponin, thrombocytopenia with platelets while 2, electrolytes renal function okay, temperature normal, no tachycardia, oxygen saturation 98% on room air, negative CT head for acute intracranial abnormalities. MRI of brain showed 15 mm subacute ischemic infarct involving the lateral left thalamus/posterior limb of the left internal capsule. CTA of head and neck did not showed high-grade stenosis, but showed 35% stenosis at the origin of the right ICA. Patient is placed on telemetry bed for observation. Neurology was consulted.   Review of Systems:   General: no fevers, chills, no body weight gain, has fatigue HEENT: has blurry vision, no hearing changes or sore throat Respiratory: no dyspnea, coughing, wheezing CV: no chest pain, no palpitations GI: no nausea, vomiting, abdominal pain, diarrhea, constipation GU: no dysuria, burning on urination, increased urinary frequency, hematuria  Ext: no leg  edema Neuro: Has dizziness and left arm numbness Skin: no rash, no skin tear. MSK: No muscle spasm, no deformity, no limitation of range of movement in spin Heme: No easy bruising.  Travel history: No recent long distant travel.  Allergy: No Known Allergies  Past Medical History:  Diagnosis Date  . Essential hypertension   . Tobacco abuse   . Type 2 diabetes mellitus ([place])     Past Surgical History:  Procedure Laterality Date  . NO PAST SURGERIES      Social History:  reports that he has been smoking Cigarettes.  He has been smoking about 1.00 pack per day. He has never used smokeless tobacco. He reports that he drinks alcohol. He reports that he does not use drugs.  Family History:  Family History  Problem Relation Age of Onset  . Heart disease Brother      Prior to Admission medications   Medication Sig Start Date End Date Taking? Authorizing Provider  amLODipine (NORVASC) 10 MG tablet Take 10 mg by mouth daily.   Yes [provider]  aspirin 81 MG chewable tablet Chew 81 mg by mouth daily.   Yes [provider]  Blood Glucose Monitoring Suppl (ONETOUCH VERIO) w/Device KIT USE TO TEST BLOOD SUGAR 02/02/17  Yes [provider]  dapagliflozin propanediol (FARXIGA) 5 MG TABS tablet Take 5 mg by mouth daily.   Yes [provider]  glucose blood test strip 1 each by Other route as needed for other. Use as instructed   Yes [provider]  lisinopril (PRINIVIL,ZESTRIL) 40 MG tablet Take 40 mg by mouth daily.   Yes [provider]  metFORMIN (GLUCOPHAGE) 500 MG tablet Take 500 mg by mouth 2 (two) times daily with a meal.    Yes [provider]  ondansetron (ZOFRAN ODT) 4 MG disintegrating tablet Take 1 tablet (4 mg total) by mouth every 8 (eight) hours as needed for nausea or vomiting. Or dizziness. 03/25/17  Yes Melvenia Beam, MD  ONETOUCH DELICA LANCETS FINE MISC USE TO TEST BLOOD SUGAR ONCE DAILY 02/02/17  Yes  [provider]  aspirin EC 325 MG EC tablet Take 1 tablet (325 mg total) by mouth daily. Patient not taking: Reported on 04/02/2017 03/29/17   Melvenia Beam, MD    Physical Exam: Vitals:   04/02/17 2200 04/02/17 2215 04/02/17 2230 04/02/17 2252  BP: (!) 161/89 (!) 161/89 (!) 157/79 (!) 156/72  Pulse: 90  94 86  Resp: 17  16 16   Temp:    98.2 F (36.8 C)  TempSrc:    Oral  SpO2: 98%  98% 97%  Weight:    88.6 kg (195 lb 4.8 oz)  Height:    5' 4"  (1.626 m)   General: Not in acute distress HEENT:       Eyes: PERRL, EOMI, no scleral icterus.       ENT: No discharge from the ears and nose, no pharynx injection, no tonsillar enlargement.        Neck: No JVD, no bruit, no mass felt. Heme: No neck lymph node enlargement. Cardiac: S1/S2, RRR, No murmurs, No gallops or rubs. Respiratory: No rales, wheezing, rhonchi or rubs. GI: Soft, nondistended, nontender, no rebound pain, no organomegaly, BS present. GU: No hematuria Ext: No pitting leg edema bilaterally. 2+DP/PT pulse bilaterally. Musculoskeletal: No joint deformities, No joint redness or warmth, no limitation of ROM in spin. Skin: No rashes.  Neuro: Alert, oriented X3, cranial nerves II-XII grossly intact. Muscle strength 5/5 in all extremities, sensation to light touch intact. Brachial reflex 2+ bilaterally. Negative Babinski's sign.  Psych: Patient is not psychotic, no suicidal or hemocidal ideation.  Labs on Admission: I have personally reviewed following labs and imaging studies  CBC:  Recent Labs Lab 04/02/17 1010 04/02/17 1041  WBC 8.5  --   NEUTROABS 5.0  --   HGB 15.9 16.0  HCT 46.9 47.0  MCV 95.3  --   PLT 102*  --    Basic Metabolic Panel:  Recent Labs Lab 04/02/17 1010 04/02/17 1041  NA 139 141  K 5.0 4.4  CL 107 105  CO2 23  --   GLUCOSE 153* 149*  BUN 17 18  CREATININE 1.05 1.00  CALCIUM 9.3  --    GFR: Estimated Creatinine Clearance: 82.8 mL/min (by C-G formula based on SCr of 1  mg/dL). Liver Function Tests:  Recent Labs Lab 04/02/17 1010  AST 33  ALT 41  ALKPHOS 76  BILITOT 1.6*  PROT 6.5  ALBUMIN 4.0   No results for input(s): LIPASE, AMYLASE in the last 168 hours. No results for input(s): AMMONIA in the last 168 hours. Coagulation Profile:  Recent Labs Lab 04/02/17 1010  INR 0.96   Cardiac Enzymes: No results for input(s): CKTOTAL, CKMB, CKMBINDEX, TROPONINI in the last 168 hours. BNP (last 3 results) No results for input(s): PROBNP in the last 8760 hours. HbA1C:  Recent Labs  04/02/17 1010  HGBA1C 8.1*   CBG:  Recent Labs Lab 04/02/17 2301  GLUCAP 206*   Lipid Profile:  Recent Labs  04/02/17 1010  CHOL 168  HDL 32*  LDLCALC 84  TRIG 260*  CHOLHDL 5.3   Thyroid Function Tests: No results for input(s): TSH, T4TOTAL, FREET4, T3FREE, THYROIDAB in the last 72 hours. Anemia Panel: No results for input(s): VITAMINB12, FOLATE, FERRITIN, TIBC, IRON, RETICCTPCT in the last 72 hours. Urine analysis: No results found for: COLORURINE, APPEARANCEUR, LABSPEC, PHURINE, GLUCOSEU, HGBUR, BILIRUBINUR, KETONESUR, PROTEINUR, UROBILINOGEN, NITRITE, LEUKOCYTESUR Sepsis Labs: @LABRCNTIP (procalcitonin:4,lacticidven:4) )No results found for this or any previous visit (from the past 240 hour(s)).   Radiological Exams on Admission: Ct Head Wo Contrast  Result Date: 04/02/2017 CLINICAL DATA:  Increasing left arm numbness over the past 3 months. EXAM: CT HEAD WITHOUT CONTRAST TECHNIQUE: Contiguous axial images were obtained from the base of the skull through the vertex without intravenous contrast. COMPARISON:  Brain MRI 02/12/2017. FINDINGS: Brain: Appears normal without hemorrhage, infarct, mass lesion, mass effect, midline shift or abnormal extra-axial fluid collection. No hydrocephalus or pneumocephalus. Vascular: Extensive atherosclerosis is seen. Skull: Intact. Sinuses/Orbits: Mild mucosal thickening is seen in scattered paranasal sinuses and  ethmoid air cells. Other: None. IMPRESSION: No acute intracranial abnormality. Extensive atherosclerosis. Mild sinus disease. Electronically Signed   By: Inge Rise M.D.   On: 04/02/2017 10:40   Mr Jodene Nam Head Wo Contrast  Result Date: 04/02/2017 CLINICAL DATA:  Initial evaluation for chronic dizziness, blurry vision, new onset left hand weakness and dizziness. EXAM: MRI HEAD WITHOUT CONTRAST MRA HEAD WITHOUT CONTRAST MRA NECK WITHOUT AND WITH CONTRAST TECHNIQUE: Multiplanar, multiecho pulse sequences of the brain and surrounding structures were obtained without intravenous contrast. Angiographic images of the Circle of Willis were obtained using MRA technique without intravenous contrast. Angiographic images of the neck were obtained using MRA technique without and with intravenous contrast. Carotid stenosis measurements (when applicable) are obtained utilizing NASCET criteria, using the distal internal carotid diameter as the denominator. CONTRAST:  54m MULTIHANCE GADOBENATE DIMEGLUMINE 529 MG/ML IV SOLN COMPARISON:  Prior CT from earlier same day as well as previous MRI from 02/12/2017. FINDINGS: MRI HEAD FINDINGS Stable atrophy. Patchy T2/FLAIR hyperintensity within the periventricular and deep white matter both cerebral hemispheres most consistent with chronic small vessel ischemic disease, mild in nature. Few scatter remote lacunar infarcts present within the periventricular white matter of the corona radiata bilaterally. Small remote left cerebellar infarct. There is a 15 mm focus of curvilinear diffusion abnormality at the lateral left thalamus/ posterior limb of the left internal capsule (series 5, image 15). Associated T2/FLAIR signal abnormality with minimally decreased ADC signal. Associated faint enhancement on post contrast sequence (series 17, image 25). Finding consistent with a subacute ischemic infarct. No associated hemorrhage or mass effect. Additional punctate focus of diffusion  abnormality within the periventricular white matter of the left corona radiata favored to reflect T2 shine through (series 5, image 19). No other evidence for acute or subacute ischemia. Gray-white matter differentiation otherwise maintained. No evidence for acute or chronic intracranial hemorrhage. No mass lesion, midline shift or mass effect. No hydrocephalus. No extra-axial fluid collection. Major dural sinuses grossly patent. No other abnormal enhancement within the brain. Pituitary gland and suprasellar region normal. Midline structures intact and normal. Major intracranial vascular flow voids maintained. Craniocervical junction normal. Degenerative spondylolysis noted at C3-4 with resultant mild spinal stenosis. Remainder the visualized upper cervical spine otherwise unremarkable. Bone marrow signal intensity within normal limits. No scalp soft tissue abnormality. Globes and orbital soft tissues within normal limits. Scattered mucosal thickening throughout the paranasal sinuses. Retained secretions present within the right sphenoid sinus. No mastoid effusion. Inner ear structures grossly  normal. MRA HEAD FINDINGS ANTERIOR CIRCULATION: Distal cervical segments of the internal carotid arteries are widely patent with antegrade flow. Petrous, cavernous, and supraclinoid segments patent without flow-limiting stenosis. ICA termini widely patent. A1 segments, anterior communicating artery, and anterior cerebral arteries patent to their distal aspects. M1 segments patent without stenosis. Normal MCA bifurcations. No proximal M2 occlusion or stenosis. Distal MCA branches well perfused and symmetric. Distal small vessel atheromatous irregularity. POSTERIOR CIRCULATION: Right vertebral artery dominant and widely patent in the vertebrobasilar junction. Left vertebral artery hypoplastic. Atheromatous irregularity with mild stenosis within the distal left V4 segment. Posterior inferior cerebral arteries patent proximally.  Basilar artery mildly irregular without flow-limiting stenosis. Superior cerebral arteries patent bilaterally. Right PCA supplied via the basilar. Fetal type origin of the left PCA supplied via a widely patent left posterior communicating artery. PCAs patent to their distal aspects without flow-limiting stenosis. Distal small vessel atheromatous irregularity. No aneurysm or vascular malformation. MRA NECK FINDINGS Source images reviewed. Visualized aortic arch of normal caliber with normal branch pattern. No flow-limiting stenosis seen about the origin of the great vessels. Visualized subclavian artery is widely patent. Right common carotid artery patent from its origin to the bifurcation without flow-limiting stenosis. Atheromatous irregularity with relatively mild narrowing of approximately 35% at the origin of the right ICA. Right ICA patent distally to the skullbase without additional stenosis or other acute abnormality. Left common carotid artery patent from its origin to the bifurcation without stenosis. No significant atheromatous narrowing about the left carotid bifurcation. Left ICA patent distally to the skullbase without stenosis or occlusion. Both of the vertebral arteries arise from the subclavian arteries. Right vertebral artery dominant. Diffusely hypoplastic left vertebral artery. Vertebral arteries mildly irregular but patent within the neck without hemodynamically significant stenosis. IMPRESSION: MRI HEAD IMPRESSION: 1. 15 mm subacute ischemic infarct involving the lateral left thalamus/posterior limb of the left internal capsule. No associated hemorrhage or mass effect. 2. Otherwise stable atrophy with chronic ischemic changes. MRA HEAD IMPRESSION: 1. Negative intracranial MRA for large vessel occlusion. No high-grade or correctable stenosis. 2. Distal small vessel atheromatous irregularity involving both the anterior and posterior circulations. MRA NECK IMPRESSION: 1. Mild atheromatous stenosis  of approximately 35% at the origin of the right ICA. Right carotid artery system otherwise widely patent. 2. Widely patent left carotid artery system without hemodynamically significant stenosis. 3. Widely patent vertebral arteries within the neck. Right vertebral artery dominant. Electronically Signed   By: Jeannine Boga M.D.   On: 04/02/2017 20:28   Mr Jodene Nam Neck W Wo Contrast  Result Date: 04/02/2017 CLINICAL DATA:  Initial evaluation for chronic dizziness, blurry vision, new onset left hand weakness and dizziness. EXAM: MRI HEAD WITHOUT CONTRAST MRA HEAD WITHOUT CONTRAST MRA NECK WITHOUT AND WITH CONTRAST TECHNIQUE: Multiplanar, multiecho pulse sequences of the brain and surrounding structures were obtained without intravenous contrast. Angiographic images of the Circle of Willis were obtained using MRA technique without intravenous contrast. Angiographic images of the neck were obtained using MRA technique without and with intravenous contrast. Carotid stenosis measurements (when applicable) are obtained utilizing NASCET criteria, using the distal internal carotid diameter as the denominator. CONTRAST:  45m MULTIHANCE GADOBENATE DIMEGLUMINE 529 MG/ML IV SOLN COMPARISON:  Prior CT from earlier same day as well as previous MRI from 02/12/2017. FINDINGS: MRI HEAD FINDINGS Stable atrophy. Patchy T2/FLAIR hyperintensity within the periventricular and deep white matter both cerebral hemispheres most consistent with chronic small vessel ischemic disease, mild in nature. Few scatter remote lacunar infarcts present within  the periventricular white matter of the corona radiata bilaterally. Small remote left cerebellar infarct. There is a 15 mm focus of curvilinear diffusion abnormality at the lateral left thalamus/ posterior limb of the left internal capsule (series 5, image 15). Associated T2/FLAIR signal abnormality with minimally decreased ADC signal. Associated faint enhancement on post contrast sequence  (series 17, image 25). Finding consistent with a subacute ischemic infarct. No associated hemorrhage or mass effect. Additional punctate focus of diffusion abnormality within the periventricular white matter of the left corona radiata favored to reflect T2 shine through (series 5, image 19). No other evidence for acute or subacute ischemia. Gray-white matter differentiation otherwise maintained. No evidence for acute or chronic intracranial hemorrhage. No mass lesion, midline shift or mass effect. No hydrocephalus. No extra-axial fluid collection. Major dural sinuses grossly patent. No other abnormal enhancement within the brain. Pituitary gland and suprasellar region normal. Midline structures intact and normal. Major intracranial vascular flow voids maintained. Craniocervical junction normal. Degenerative spondylolysis noted at C3-4 with resultant mild spinal stenosis. Remainder the visualized upper cervical spine otherwise unremarkable. Bone marrow signal intensity within normal limits. No scalp soft tissue abnormality. Globes and orbital soft tissues within normal limits. Scattered mucosal thickening throughout the paranasal sinuses. Retained secretions present within the right sphenoid sinus. No mastoid effusion. Inner ear structures grossly normal. MRA HEAD FINDINGS ANTERIOR CIRCULATION: Distal cervical segments of the internal carotid arteries are widely patent with antegrade flow. Petrous, cavernous, and supraclinoid segments patent without flow-limiting stenosis. ICA termini widely patent. A1 segments, anterior communicating artery, and anterior cerebral arteries patent to their distal aspects. M1 segments patent without stenosis. Normal MCA bifurcations. No proximal M2 occlusion or stenosis. Distal MCA branches well perfused and symmetric. Distal small vessel atheromatous irregularity. POSTERIOR CIRCULATION: Right vertebral artery dominant and widely patent in the vertebrobasilar junction. Left vertebral  artery hypoplastic. Atheromatous irregularity with mild stenosis within the distal left V4 segment. Posterior inferior cerebral arteries patent proximally. Basilar artery mildly irregular without flow-limiting stenosis. Superior cerebral arteries patent bilaterally. Right PCA supplied via the basilar. Fetal type origin of the left PCA supplied via a widely patent left posterior communicating artery. PCAs patent to their distal aspects without flow-limiting stenosis. Distal small vessel atheromatous irregularity. No aneurysm or vascular malformation. MRA NECK FINDINGS Source images reviewed. Visualized aortic arch of normal caliber with normal branch pattern. No flow-limiting stenosis seen about the origin of the great vessels. Visualized subclavian artery is widely patent. Right common carotid artery patent from its origin to the bifurcation without flow-limiting stenosis. Atheromatous irregularity with relatively mild narrowing of approximately 35% at the origin of the right ICA. Right ICA patent distally to the skullbase without additional stenosis or other acute abnormality. Left common carotid artery patent from its origin to the bifurcation without stenosis. No significant atheromatous narrowing about the left carotid bifurcation. Left ICA patent distally to the skullbase without stenosis or occlusion. Both of the vertebral arteries arise from the subclavian arteries. Right vertebral artery dominant. Diffusely hypoplastic left vertebral artery. Vertebral arteries mildly irregular but patent within the neck without hemodynamically significant stenosis. IMPRESSION: MRI HEAD IMPRESSION: 1. 15 mm subacute ischemic infarct involving the lateral left thalamus/posterior limb of the left internal capsule. No associated hemorrhage or mass effect. 2. Otherwise stable atrophy with chronic ischemic changes. MRA HEAD IMPRESSION: 1. Negative intracranial MRA for large vessel occlusion. No high-grade or correctable stenosis.  2. Distal small vessel atheromatous irregularity involving both the anterior and posterior circulations. MRA NECK IMPRESSION: 1. Mild  atheromatous stenosis of approximately 35% at the origin of the right ICA. Right carotid artery system otherwise widely patent. 2. Widely patent left carotid artery system without hemodynamically significant stenosis. 3. Widely patent vertebral arteries within the neck. Right vertebral artery dominant. Electronically Signed   By: Jeannine Boga M.D.   On: 04/02/2017 20:28   Mr Jeri Cos And Wo Contrast  Result Date: 04/02/2017 CLINICAL DATA:  Initial evaluation for chronic dizziness, blurry vision, new onset left hand weakness and dizziness. EXAM: MRI HEAD WITHOUT CONTRAST MRA HEAD WITHOUT CONTRAST MRA NECK WITHOUT AND WITH CONTRAST TECHNIQUE: Multiplanar, multiecho pulse sequences of the brain and surrounding structures were obtained without intravenous contrast. Angiographic images of the Circle of Willis were obtained using MRA technique without intravenous contrast. Angiographic images of the neck were obtained using MRA technique without and with intravenous contrast. Carotid stenosis measurements (when applicable) are obtained utilizing NASCET criteria, using the distal internal carotid diameter as the denominator. CONTRAST:  45m MULTIHANCE GADOBENATE DIMEGLUMINE 529 MG/ML IV SOLN COMPARISON:  Prior CT from earlier same day as well as previous MRI from 02/12/2017. FINDINGS: MRI HEAD FINDINGS Stable atrophy. Patchy T2/FLAIR hyperintensity within the periventricular and deep white matter both cerebral hemispheres most consistent with chronic small vessel ischemic disease, mild in nature. Few scatter remote lacunar infarcts present within the periventricular white matter of the corona radiata bilaterally. Small remote left cerebellar infarct. There is a 15 mm focus of curvilinear diffusion abnormality at the lateral left thalamus/ posterior limb of the left internal  capsule (series 5, image 15). Associated T2/FLAIR signal abnormality with minimally decreased ADC signal. Associated faint enhancement on post contrast sequence (series 17, image 25). Finding consistent with a subacute ischemic infarct. No associated hemorrhage or mass effect. Additional punctate focus of diffusion abnormality within the periventricular white matter of the left corona radiata favored to reflect T2 shine through (series 5, image 19). No other evidence for acute or subacute ischemia. Gray-white matter differentiation otherwise maintained. No evidence for acute or chronic intracranial hemorrhage. No mass lesion, midline shift or mass effect. No hydrocephalus. No extra-axial fluid collection. Major dural sinuses grossly patent. No other abnormal enhancement within the brain. Pituitary gland and suprasellar region normal. Midline structures intact and normal. Major intracranial vascular flow voids maintained. Craniocervical junction normal. Degenerative spondylolysis noted at C3-4 with resultant mild spinal stenosis. Remainder the visualized upper cervical spine otherwise unremarkable. Bone marrow signal intensity within normal limits. No scalp soft tissue abnormality. Globes and orbital soft tissues within normal limits. Scattered mucosal thickening throughout the paranasal sinuses. Retained secretions present within the right sphenoid sinus. No mastoid effusion. Inner ear structures grossly normal. MRA HEAD FINDINGS ANTERIOR CIRCULATION: Distal cervical segments of the internal carotid arteries are widely patent with antegrade flow. Petrous, cavernous, and supraclinoid segments patent without flow-limiting stenosis. ICA termini widely patent. A1 segments, anterior communicating artery, and anterior cerebral arteries patent to their distal aspects. M1 segments patent without stenosis. Normal MCA bifurcations. No proximal M2 occlusion or stenosis. Distal MCA branches well perfused and symmetric. Distal  small vessel atheromatous irregularity. POSTERIOR CIRCULATION: Right vertebral artery dominant and widely patent in the vertebrobasilar junction. Left vertebral artery hypoplastic. Atheromatous irregularity with mild stenosis within the distal left V4 segment. Posterior inferior cerebral arteries patent proximally. Basilar artery mildly irregular without flow-limiting stenosis. Superior cerebral arteries patent bilaterally. Right PCA supplied via the basilar. Fetal type origin of the left PCA supplied via a widely patent left posterior communicating artery. PCAs patent to their  distal aspects without flow-limiting stenosis. Distal small vessel atheromatous irregularity. No aneurysm or vascular malformation. MRA NECK FINDINGS Source images reviewed. Visualized aortic arch of normal caliber with normal branch pattern. No flow-limiting stenosis seen about the origin of the great vessels. Visualized subclavian artery is widely patent. Right common carotid artery patent from its origin to the bifurcation without flow-limiting stenosis. Atheromatous irregularity with relatively mild narrowing of approximately 35% at the origin of the right ICA. Right ICA patent distally to the skullbase without additional stenosis or other acute abnormality. Left common carotid artery patent from its origin to the bifurcation without stenosis. No significant atheromatous narrowing about the left carotid bifurcation. Left ICA patent distally to the skullbase without stenosis or occlusion. Both of the vertebral arteries arise from the subclavian arteries. Right vertebral artery dominant. Diffusely hypoplastic left vertebral artery. Vertebral arteries mildly irregular but patent within the neck without hemodynamically significant stenosis. IMPRESSION: MRI HEAD IMPRESSION: 1. 15 mm subacute ischemic infarct involving the lateral left thalamus/posterior limb of the left internal capsule. No associated hemorrhage or mass effect. 2. Otherwise  stable atrophy with chronic ischemic changes. MRA HEAD IMPRESSION: 1. Negative intracranial MRA for large vessel occlusion. No high-grade or correctable stenosis. 2. Distal small vessel atheromatous irregularity involving both the anterior and posterior circulations. MRA NECK IMPRESSION: 1. Mild atheromatous stenosis of approximately 35% at the origin of the right ICA. Right carotid artery system otherwise widely patent. 2. Widely patent left carotid artery system without hemodynamically significant stenosis. 3. Widely patent vertebral arteries within the neck. Right vertebral artery dominant. Electronically Signed   By: Jeannine Boga M.D.   On: 04/02/2017 20:28     EKG: Independently reviewed.  Sinus rhythm, QTC 470, LAD, poor R-wave progression, mild T-wave inversion in lead 1/aVL    Assessment/Plan Principal Problem:   Stroke (cerebrum) (HCC) Active Problems:   Uncontrolled diabetes mellitus (HCC)   HTN (hypertension)   Thrombocytopenia (HCC)   Tobacco abuse   Left arm numbness   Stroke (cerebrum) [facility]):  MRI of brain showed 15 mm subacute ischemic infarct involving the lateral left thalamus/posterior limb of the left internal capsule. CTA of head and neck did not showed high-grade stenosis, but showed 35% stenosis at the origin of the right ICA.  Neurology was consulted, recommended to complete stroke workup. LDL 84 and A1c 8.1 today.  - will place on tele bed for obs - ASA  -start lipitor 40 mg daily (ideally to lower LDL to <70) - 2D transthoracic echocardiography  - Check UDS  - PT/OT consult  Uncontrolled diabetes mellitus ([place]): Last A1c 8/1 on 04/02/17, poorly controled. Patient is taking metformin and Fraxiga at home -SSI  HTN (hypertension): -continue amlodipine, lisinopril -IV hydralazine when necessary  Left arm numbness: likely due to carpal tunnel syndrome -Apply splint wrist  Tobacco abuse: -Did counseling about importance of quitting smoking -Nicotine  patch  Thrombocytopenia ([place]): Etiology is not clear. Likely related to 102. No bleeding tendency. Mental status normal, unlikely to have TTP. -Check LDH and peripheral smear -Follow-up CBC     DVT ppx: SCD Code Status: Full code Family Communication: Yes, patient's brother and sister at bed side Disposition Plan:  Anticipate discharge back to previous home environment Consults called: neurology, dr. Rory Percy and Dr. Leonel Ramsay Admission status: Obs / tele    Date of Service 04/03/2017    Ivor Costa [facility] Pager [phone]  If 7PM-7AM, please contact night-coverage [email or web address] Password [email or web address] 04/03/2017, 12:56 AM

## 2017-04-02 NOTE — Telephone Encounter (Signed)
Called to try to discuss MRI of the brain  which was completed last month at [facility] [place](Prior to his first appointment with me). MRI of the brain has been resulted last month already and patient knows the results, I had asked for the CD of imaging to compare it to a repeat MRI  (which is ordered but has not been completed yet).  Patient with acute onset left hand numbness and dizziness. MRI at [facility] showed 3 chronic strokes. I suspect he had a brainstem stroke too small to be seen on MRI brain.  He has 3 chronic lacunar infarcts and I don't think any one of them would explain all his symptoms but he does have a right thalamic stroke (numbness in left hand) and a cerebellar stroke (dizziness) but these would have to happen at the same time and they look ischemic not embolic.   Patient is in in the ED currently. discussed with emergency room doctor I would like patient to be considered for admission in order to have the stroke team evaluate him in the morning. Spoke to neuro hospitalist for evaluation based on neural hospitalist's clinical evaluation on whether admission is appropriate and, if so, MRI of the brain with and without contrast would be helpful.

## 2017-04-02 NOTE — ED Notes (Signed)
Dr Niu at bedside 

## 2017-04-02 NOTE — Telephone Encounter (Signed)
Called to discuss MRI of the brain which was completed last month at [facility]. MRI of the brain has been resulted already and patient knows the results I had asked for the imaging to compare the repeat MRI to which has not been completed yet.  Patient in the ED. discussed with emergency room doctor I would like patient to be considered for admission in order to have the stroke team evaluate him in the morning. Spoke to neuro hospitalist for evaluation based on neural hospitalist's clinical evaluation on whether admission is appropriate and, if so, MRI of the brain with and without contrast would be helpful.

## 2017-04-02 NOTE — Telephone Encounter (Signed)
Returned patient's call regarding dizziness & eyesight getting worse. I have spoken with Dr. Jaynee Eagles. Per Dr. Jaynee Eagles, pt needs to go to the ED to r/o any new issues/stroke. I advised him not to drive himself. He said family could take him and I advised him to call 911 for EMS assistance d/t to their ability to help in ways family cannot. He verbalized understanding. I also told him that his previous MRI is under review by Dr. Jaynee Eagles and that she will be calling him with the results. He verbalized understanding and I told him to keep [place] informed.

## 2017-04-02 NOTE — ED Notes (Signed)
Ortho paged. 

## 2017-04-02 NOTE — ED Notes (Signed)
Patient transported to MRI 

## 2017-04-02 NOTE — ED Notes (Signed)
Pt and pt family asking if pt can eat. Pt passed stroke swallow. Will ask Dr. Roderic Palau.

## 2017-04-02 NOTE — ED Provider Notes (Signed)
Pt signed out by Dr. Roderic Palau pending results of MRI.  IMPRESSION: MRI HEAD IMPRESSION:  1. 15 mm subacute ischemic infarct involving the lateral left thalamus/posterior limb of the left internal capsule. No associated hemorrhage or mass effect. 2. Otherwise stable atrophy with chronic ischemic changes.  MRA HEAD IMPRESSION:  1. Negative intracranial MRA for large vessel occlusion. No high-grade or correctable stenosis. 2. Distal small vessel atheromatous irregularity involving both the anterior and posterior circulations.  MRA NECK IMPRESSION:  1. Mild atheromatous stenosis of approximately 35% at the origin of the right ICA. Right carotid artery system otherwise widely patent. 2. Widely patent left carotid artery system without hemodynamically significant stenosis. 3. Widely patent vertebral arteries within the neck. Right vertebral artery dominant.  Pt d/w Dr. Leonel Ramsay (neurology) who feels pt should be admitted for CVA work up as there is a new, subacute cva.  The pt's left hand numbness likely carpal tunnel syndrome, but he has a subacute infarct lateral left thalamus.  Pt d/w Dr. Blaine Hamper ([facility]) who will admit.   Isla Pence, MD 04/02/17 2134

## 2017-04-02 NOTE — ED Notes (Signed)
Malike Foglio (brother) cell [phone], home [phone] Jone Baseman) (sister); home [phone] cell [phone]

## 2017-04-02 NOTE — ED Notes (Signed)
Pt provided with turkey sandwich and diet coke 

## 2017-04-02 NOTE — ED Notes (Signed)
Neurology bedside at this time

## 2017-04-02 NOTE — Progress Notes (Signed)
Orthopedic Tech Progress Note Patient Details:  Eugene Harding [birth year] [id number]  Ortho Devices Type of Ortho Device: Velcro wrist forearm splint Ortho Device/Splint Location: LUE Ortho Device/Splint Interventions: Ordered, Application   Braulio Bosch 04/02/2017, 9:31 PM

## 2017-04-02 NOTE — Consult Note (Addendum)
Requesting Physician: Dr. Roderic Palau    Chief Complaint: Dizziness   History obtained from:  Patient    HPI:                                                                                                                                         Eugene Harding is an 56 y.o. male Who is well know to Dr. Jaynee Eagles for previous strokes and chronic dizziness and blurred vision.   "Patient with acute onset left hand numbness and dizziness. MRI at [facility] showed 3 chronic strokes. I suspect he had a brainstem stroke too small to be seen on MRI brain.  He has 3 chronic lacunar infarcts and I don't think any one of them would explain all his symptoms but he does have a right thalamic stroke (numbness in left hand) and a cerebellar stroke (dizziness) but these would have to happen at the same time and they look ischemic not embolic. "   Currently he feels his symptoms have worsened over the last 4 days.  For this reason he will obtain MRI/MRA brain and neck.   Date last known well: Unable to determine Time last known well: Unable to determine tPA Given: No: out of window Modified Rankin: Rankin Score=0    Past Medical History:  Diagnosis Date  . Essential hypertension   . Type 2 diabetes mellitus ([place])     Past Surgical History:  Procedure Laterality Date  . NO PAST SURGERIES      Family History  Problem Relation Age of Onset  . Heart disease Brother    Social History:  reports that he has been smoking Cigarettes.  He has been smoking about 1.00 pack per day. He has never used smokeless tobacco. He reports that he drinks alcohol. He reports that he does not use drugs.  Allergies: No Known Allergies  Medications:                                                                                                                           No current facility-administered medications for this encounter.    Current Outpatient Prescriptions  Medication Sig Dispense Refill  . amLODipine  (NORVASC) 10 MG tablet Take 10 mg by mouth daily.    Marland Kitchen aspirin 81 MG chewable tablet Chew 81 mg by mouth daily.    . Blood Glucose  Monitoring Suppl (ONETOUCH VERIO) w/Device KIT USE TO TEST BLOOD SUGAR  3  . dapagliflozin propanediol (FARXIGA) 5 MG TABS tablet Take 5 mg by mouth daily.    Marland Kitchen glucose blood test strip 1 each by Other route as needed for other. Use as instructed    . lisinopril (PRINIVIL,ZESTRIL) 40 MG tablet Take 40 mg by mouth daily.    . metFORMIN (GLUCOPHAGE) 500 MG tablet Take 500 mg by mouth 2 (two) times daily with a meal.     . ondansetron (ZOFRAN ODT) 4 MG disintegrating tablet Take 1 tablet (4 mg total) by mouth every 8 (eight) hours as needed for nausea or vomiting. Or dizziness. 60 tablet 6  . ONETOUCH DELICA LANCETS FINE MISC USE TO TEST BLOOD SUGAR ONCE DAILY  2  . aspirin EC 325 MG EC tablet Take 1 tablet (325 mg total) by mouth daily. (Patient not taking: Reported on 04/02/2017) 30 tablet 1     ROS:                                                                                                                                       History obtained from the patient  General ROS: negative for - chills, fatigue, fever, night sweats, weight gain or weight loss Psychological ROS: negative for - behavioral disorder, hallucinations, memory difficulties, mood swings or suicidal ideation Ophthalmic ROS: negative for - blurry vision, double vision, eye pain or loss of vision ENT ROS: negative for - epistaxis, nasal discharge, oral lesions, sore throat, tinnitus or vertigo Allergy and Immunology ROS: negative for - hives or itchy/watery eyes Hematological and Lymphatic ROS: negative for - bleeding problems, bruising or swollen lymph nodes Endocrine ROS: negative for - galactorrhea, hair pattern changes, polydipsia/polyuria or temperature intolerance Respiratory ROS: negative for - cough, hemoptysis, shortness of breath or wheezing Cardiovascular ROS: negative for - chest  pain, dyspnea on exertion, edema or irregular heartbeat Gastrointestinal ROS: negative for - abdominal pain, diarrhea, hematemesis, nausea/vomiting or stool incontinence Genito-Urinary ROS: negative for - dysuria, hematuria, incontinence or urinary frequency/urgency Musculoskeletal ROS: negative for - joint swelling or muscular weakness Neurological ROS: as noted in HPI Dermatological ROS: negative for rash and skin lesion changes  Neurologic Examination:                                                                                                      Blood pressure 139/73, pulse 85, temperature 98.4 F (36.9 C), temperature source Oral, resp. rate Marland Kitchen)  21, SpO2 98 %.  HEENT-  Normocephalic, no lesions, without obvious abnormality.  Normal external eye and conjunctiva.  Normal TM's bilaterally.  Normal auditory canals and external ears. Normal external nose, mucus membranes and septum.  Normal pharynx. Cardiovascular- S1, S2 normal, pulses palpable throughout   Lungs- chest clear, no wheezing, rales, normal symmetric air entry Abdomen- normal findings: bowel sounds normal Extremities- no edema Lymph-no adenopathy palpable Musculoskeletal-no joint tenderness, deformity or swelling Skin-warm and dry, no hyperpigmentation, vitiligo, or suspicious lesions  Neurological Examination Mental Status: Alert, oriented, thought content appropriate.  Speech fluent without evidence of aphasia.  Able to follow 3 step commands without difficulty. Cranial Nerves: II: ; Visual fields grossly normal,  III,IV, VI: ptosis not present, extra-ocular motions intact bilaterally, pupils equal, round, reactive to light and accommodation V,VII: smile symmetric, facial light touch sensation normal bilaterally VIII: hearing normal bilaterally IX,X: uvula rises symmetrically XI: bilateral shoulder shrug XII: midline tongue extension Motor: Right : Upper extremity   5/5    Left:     Upper extremity   5/5  Lower  extremity   5/5     Lower extremity   5/5 Tone and bulk:normal tone throughout; no atrophy noted Sensory: Pinprick and light touch intact throughout, bilaterally--positive Tinnel's and Phalen's Deep Tendon Reflexes: 2+ and symmetric throughout Plantars: Right: downgoing   Left: downgoing Cerebellar: normal finger-to-nose,and normal heel-to-shin test Gait: normal gait and station with some imbalance with heel-toe       Lab Results: Basic Metabolic Panel:  Recent Labs Lab 04/02/17 1010 04/02/17 1041  NA 139 141  K 5.0 4.4  CL 107 105  CO2 23  --   GLUCOSE 153* 149*  BUN 17 18  CREATININE 1.05 1.00  CALCIUM 9.3  --     Liver Function Tests:  Recent Labs Lab 04/02/17 1010  AST 33  ALT 41  ALKPHOS 76  BILITOT 1.6*  PROT 6.5  ALBUMIN 4.0    CBC:  Recent Labs Lab 04/02/17 1010 04/02/17 1041  WBC 8.5  --   NEUTROABS 5.0  --   HGB 15.9 16.0  HCT 46.9 47.0  MCV 95.3  --   PLT 102*  --      Coagulation Studies:  Recent Labs  04/02/17 1010  LABPROT 12.7  INR 0.96    Imaging: Ct Head Wo Contrast  Result Date: 04/02/2017 CLINICAL DATA:  Increasing left arm numbness over the past 3 months. EXAM: CT HEAD WITHOUT CONTRAST TECHNIQUE: Contiguous axial images were obtained from the base of the skull through the vertex without intravenous contrast. COMPARISON:  Brain MRI 02/12/2017. FINDINGS: Brain: Appears normal without hemorrhage, infarct, mass lesion, mass effect, midline shift or abnormal extra-axial fluid collection. No hydrocephalus or pneumocephalus. Vascular: Extensive atherosclerosis is seen. Skull: Intact. Sinuses/Orbits: Mild mucosal thickening is seen in scattered paranasal sinuses and ethmoid air cells. Other: None. IMPRESSION: No acute intracranial abnormality. Extensive atherosclerosis. Mild sinus disease. Electronically Signed   By: Inge Rise M.D.   On: 04/02/2017 10:40   Echocardiogram 03/15/2017 showed mild LVH, LVEF 55-60%, mildly  calcified mitral valve annulus, no PFO, normal LA size.  Stroke Risk Factors - diabetes mellitus and hypertension  Assessment and plan discussed with with attending physician and they are in agreement.    Etta Quill PA-C [facility] [phone]  04/02/2017, 1:23 PM   Attending Neurohospitalist Addendum Patient seen and examined. Agree with the history and physical as documented above. Agree with the plan as documented, which I helped  formulate. I have independently obtaining history, and review of systems on my exam and the patient and reviewed the chart including pertinent labs and pertinent imaging.  Briefly, he is an established patient of Dr. Lavell Anchors, who made me aware of the patient being in the emergency room. He has been complaining of worsening numbness on his left hand, below the wrist, as well as worsening balance and blurred vision which she describes as not being able to see numbers sharply. He denies any preceding headaches. As far as his worsening balance and dizziness goes, he describes that as a sensation of being on a rocking boat. He is a current smoker.  Denies illicit drugs. Review of systems, past history as documented above.  On examination General exam: Well-developed well-nourished no acute distress HEENT: Normocephalic atraumatic CVS: Z7-Q7 heard regular rate rhythm Respiratory: Chest clear to auscultation Extremities: No obvious deformities.  Phalen's and Tinel's sign positive on the left wrist. Awake alert oriented x3 Speech is clear.  Naming comprehension and repetition intact. Cranial nerves: Pupils equal round reactive to light, extraocular movements intact with no nystagmus, visual fields full, face symmetric, facial sensation intact, palate elevates at midline, shoulder shrug intact, tongue midline. Motor exam: 5/5 all over with the exception of mild hand grip weakness on the left.  Normal tone.  Normal range of motion. Sensory exam:  Intact to light touch all over.  Romberg negative. Coordination: Intact finger-nose-finger bilaterally DTRs: 2+ all over Gait: Intact.  Stress gaits with tandem walk, Heel Walk and Toe Walk intact.  Assessment and plan 56 year old man with problems of blurred vision and chronic dizziness that has been present for the last 3 months but has worsened over the last 3-5 days being evaluated in the emergency room upon the request of the ER provider as well as Dr. Lavell Anchors from [facility] neurology. My exam this afternoon in the emergency room is significant for left wrist median neuropathy but my suspicion for an underlying stroke as a cause of his symptoms is low.  Impression Evaluate for posterior circulation stroke Left wrist median neuropathy Chronic dizziness  Recommendations: I will recommend obtaining an MRI of the brain with and without contrast as has been requested by his outpatient neurologist.  I will also add an MRA of the head and neck to evaluate his cerebral and cervical vasculature. I will recommend drawing hemoglobin A1c and lipid panel as a part for completing his stroke workup. Continue aspirin and statin for stroke prevention I have also advised that he used a cockup wrist splint on his left wrist while sleeping for symptomatic relief of the left wrist median neuropathy. He should also be evaluated by EMG nerve conduction study of the left upper extremity for possible carpal tunnel syndrome as an outpatient. If his dizziness continues to be a problem for him, he should consider getting outpatient vestibular rehabilitation. If MRI shows stroke, he will need to be admitted for repeating all the stroke workup as well as telemetry.  Please call neurology with questions  Amie Portland, MD [facility] [phone] If 7pm to 7am, please call on call as listed on AMION.

## 2017-04-02 NOTE — Progress Notes (Signed)
MRI shows incidental acute stroke.  I suspect small vessel disease.  He will need to be admitted for stroke workup including LDL, A1c, echo, telemetry, rehab eval.    Roland Rack, MD [facility] [phone]  If 7pm- 7am, please page neurology on call as listed in [place].

## 2017-04-02 NOTE — ED Provider Notes (Signed)
[facility]ERGENCY DEPARTMENT Provider Note   CSN: [id number] Arrival date & time: 04/02/17  0957     History   Chief Complaint Chief Complaint  Patient presents with  . Dizziness    HPI Eugene Harding is a 56 y.o. male.  Patient complaint of dizziness and numbness in left hand with also some blurred vision.  He has been evaluated by [facility] neurology the doctor's name is Dr.Ahen.  I spoke with this neurologist and she stated she would like to get an MRI of the patient.  She spoke with our neurologist here and told him about the plan   The history is provided by the patient.  Dizziness  Quality:  Lightheadedness Severity:  Moderate Onset quality:  Gradual Timing:  Constant Progression:  Waxing and waning Chronicity:  New Context: bending over   Associated symptoms: no chest pain, no diarrhea and no headaches     Past Medical History:  Diagnosis Date  . Essential hypertension   . Type 2 diabetes mellitus [facility])     Patient Active Problem List   Diagnosis Date Noted  . Small vessel stroke ([place]) 03/29/2017  . Uncontrolled diabetes mellitus ([place]) 03/29/2017  . HTN (hypertension) 03/29/2017    Past Surgical History:  Procedure Laterality Date  . NO PAST SURGERIES         Home Medications    Prior to Admission medications   Medication Sig Start Date End Date Taking? Authorizing Provider  amLODipine (NORVASC) 10 MG tablet Take 10 mg by mouth daily.   Yes [provider]  aspirin 81 MG chewable tablet Chew 81 mg by mouth daily.   Yes [provider]  Blood Glucose Monitoring Suppl (ONETOUCH VERIO) w/Device KIT USE TO TEST BLOOD SUGAR 02/02/17  Yes [provider]  dapagliflozin propanediol (FARXIGA) 5 MG TABS tablet Take 5 mg by mouth daily.   Yes [provider]  glucose blood test strip 1 each by Other route as needed for other. Use as instructed   Yes [provider]  lisinopril  (PRINIVIL,ZESTRIL) 40 MG tablet Take 40 mg by mouth daily.   Yes [provider]  metFORMIN (GLUCOPHAGE) 500 MG tablet Take 500 mg by mouth 2 (two) times daily with a meal.    Yes [provider]  ondansetron (ZOFRAN ODT) 4 MG disintegrating tablet Take 1 tablet (4 mg total) by mouth every 8 (eight) hours as needed for nausea or vomiting. Or dizziness. 03/25/17  Yes Melvenia Beam, MD  ONETOUCH DELICA LANCETS FINE MISC USE TO TEST BLOOD SUGAR ONCE DAILY 02/02/17  Yes [provider]  aspirin EC 325 MG EC tablet Take 1 tablet (325 mg total) by mouth daily. Patient not taking: Reported on 04/02/2017 03/29/17   Melvenia Beam, MD    Family History Family History  Problem Relation Age of Onset  . Heart disease Brother     Social History Social History  Substance Use Topics  . Smoking status: Current Every Day Smoker    Packs/day: 1.00    Types: Cigarettes  . Smokeless tobacco: Never Used  . Alcohol use Yes     Comment: occaisonally     Allergies   Patient has no known allergies.   Review of Systems Review of Systems  Constitutional: Negative for appetite change and fatigue.  HENT: Negative for congestion, ear discharge and sinus pressure.   Eyes: Negative for discharge.  Respiratory: Negative for cough.   Cardiovascular: Negative for chest  pain.  Gastrointestinal: Negative for abdominal pain and diarrhea.  Genitourinary: Negative for frequency and hematuria.  Musculoskeletal: Negative for back pain.  Skin: Negative for rash.  Neurological: Positive for dizziness. Negative for seizures and headaches.  Psychiatric/Behavioral: Negative for hallucinations.     Physical Exam Updated Vital Signs BP (!) 143/70   Pulse 76   Temp 98.4 F (36.9 C) (Oral)   Resp 17   SpO2 95%   Physical Exam  Constitutional: He is oriented to person, place, and time. He appears well-developed.  HENT:  Head: Normocephalic.  Eyes: Conjunctivae and EOM are normal.  No scleral icterus.  Neck: Neck supple. No thyromegaly present.  Cardiovascular: Normal rate and regular rhythm.  Exam reveals no gallop and no friction rub.   No murmur heard. Pulmonary/Chest: No stridor. He has no wheezes. He has no rales. He exhibits no tenderness.  Abdominal: He exhibits no distension. There is no tenderness. There is no rebound.  Musculoskeletal: Normal range of motion. He exhibits no edema.  Mild decreased sensation and strength in left arm  Lymphadenopathy:    He has no cervical adenopathy.  Neurological: He is oriented to person, place, and time. He exhibits normal muscle tone. Coordination normal.  Skin: No rash noted. No erythema.  Psychiatric: He has a normal mood and affect. His behavior is normal.     ED Treatments / Results  Labs (all labs ordered are listed, but only abnormal results are displayed) Labs Reviewed  CBC - Abnormal; Notable for the following:       Result Value   Platelets 102 (*)    All other components within normal limits  COMPREHENSIVE METABOLIC PANEL - Abnormal; Notable for the following:    Glucose, Bld 153 (*)    Total Bilirubin 1.6 (*)    All other components within normal limits  HEMOGLOBIN A1C - Abnormal; Notable for the following:    Hgb A1c MFr Bld 8.1 (*)    All other components within normal limits  LIPID PANEL - Abnormal; Notable for the following:    Triglycerides 260 (*)    HDL 32 (*)    VLDL 52 (*)    All other components within normal limits  I-STAT CHEM 8, ED - Abnormal; Notable for the following:    Glucose, Bld 149 (*)    Calcium, Ion 1.13 (*)    All other components within normal limits  PROTIME-INR  APTT  DIFFERENTIAL  I-STAT TROPONIN, ED    EKG  EKG Interpretation  Date/Time:  Friday April 02 2017 10:11:52 EDT Ventricular Rate:  94 PR Interval:  142 QRS Duration: 92 QT Interval:  376 QTC Calculation: 470 R Axis:   -44 Text Interpretation:  Normal sinus rhythm Left axis deviation Abnormal  ECG Confirmed by Milton Ferguson [phone]) on 04/02/2017 12:09:49 PM       Radiology Ct Head Wo Contrast  Result Date: 04/02/2017 CLINICAL DATA:  Increasing left arm numbness over the past 3 months. EXAM: CT HEAD WITHOUT CONTRAST TECHNIQUE: Contiguous axial images were obtained from the base of the skull through the vertex without intravenous contrast. COMPARISON:  Brain MRI 02/12/2017. FINDINGS: Brain: Appears normal without hemorrhage, infarct, mass lesion, mass effect, midline shift or abnormal extra-axial fluid collection. No hydrocephalus or pneumocephalus. Vascular: Extensive atherosclerosis is seen. Skull: Intact. Sinuses/Orbits: Mild mucosal thickening is seen in scattered paranasal sinuses and ethmoid air cells. Other: None. IMPRESSION: No acute intracranial abnormality. Extensive atherosclerosis. Mild sinus disease. Electronically Signed   By: Marcello Moores  Dalessio M.D.   On: 04/02/2017 10:40    Procedures Procedures (including critical care time)  Medications Ordered in ED Medications - No data to display   Initial Impression / Assessment and Plan / ED Course  I have reviewed the triage vital signs and the nursing notes.  Pertinent labs & imaging results that were available during my care of the patient were reviewed by me and considered in my medical decision making (see chart for details). The patient's neurologist spoke with the hospital neurologist and it was decided to get an MRI of his head and the neurologist here did consult on the patient and feels like the patient can be discharged home for follow-up with his neurologist if the MRI is negative      Final Clinical Impressions(s) / ED Diagnoses   Final diagnoses:  None    New Prescriptions New Prescriptions   No medications on file     Milton Ferguson, MD 04/02/17 1628

## 2017-04-03 ENCOUNTER — Observation Stay (HOSPITAL_COMMUNITY): Payer: BLUE CROSS/BLUE SHIELD

## 2017-04-03 DIAGNOSIS — I639 Cerebral infarction, unspecified: Secondary | ICD-10-CM | POA: Diagnosis not present

## 2017-04-03 DIAGNOSIS — E1165 Type 2 diabetes mellitus with hyperglycemia: Secondary | ICD-10-CM | POA: Diagnosis not present

## 2017-04-03 DIAGNOSIS — R200 Anesthesia of skin: Secondary | ICD-10-CM | POA: Diagnosis not present

## 2017-04-03 DIAGNOSIS — I10 Essential (primary) hypertension: Secondary | ICD-10-CM | POA: Diagnosis not present

## 2017-04-03 DIAGNOSIS — I63312 Cerebral infarction due to thrombosis of left middle cerebral artery: Secondary | ICD-10-CM | POA: Diagnosis not present

## 2017-04-03 DIAGNOSIS — Z720 Tobacco use: Secondary | ICD-10-CM | POA: Diagnosis not present

## 2017-04-03 LAB — RAPID URINE DRUG SCREEN, HOSP PERFORMED
AMPHETAMINES: NOT DETECTED
BENZODIAZEPINES: NOT DETECTED
Barbiturates: NOT DETECTED
COCAINE: NOT DETECTED
OPIATES: NOT DETECTED
Tetrahydrocannabinol: NOT DETECTED

## 2017-04-03 LAB — HIV ANTIBODY (ROUTINE TESTING W REFLEX): HIV SCREEN 4TH GENERATION: NONREACTIVE

## 2017-04-03 LAB — GLUCOSE, CAPILLARY
GLUCOSE-CAPILLARY: 153 mg/dL — AB (ref 65–99)
GLUCOSE-CAPILLARY: 200 mg/dL — AB (ref 65–99)

## 2017-04-03 MED ORDER — ATORVASTATIN CALCIUM 80 MG PO TABS: 40.0000 mg | ORAL_TABLET | Freq: Every day | ORAL | 0 refills | Status: DC

## 2017-04-03 MED ORDER — CLOPIDOGREL BISULFATE 75 MG PO TABS
75.0000 mg | ORAL_TABLET | Freq: Every day | ORAL | Status: DC
Start: 2017-04-03 — End: 2017-04-03

## 2017-04-03 MED ORDER — CLOPIDOGREL BISULFATE 75 MG PO TABS: 75.0000 mg | ORAL_TABLET | Freq: Every day | ORAL | 0 refills | Status: AC

## 2017-04-03 MED ORDER — ASPIRIN 81 MG PO CHEW: 81.0000 mg | CHEWABLE_TABLET | Freq: Every day | ORAL | 0 refills | Status: AC

## 2017-04-03 NOTE — Care Management Note (Signed)
Case Management Note  Patient Details  Name: Eugene Harding MRN: [id number] Date of Birth: [birth year]  Subjective/Objective:   Pt to be discharged with Outpt PT which has already been placed in chart for UE weakness.                  Action/Plan:CM will sign off for now but will be available should additional discharge needs arise or disposition change.    Expected Discharge Date:  04/03/17               Expected Discharge Plan:     In-House Referral:     Discharge planning Services  CM Consult  Post Acute Care Choice:    Choice offered to:     DME Arranged:    DME Agency:     HH Arranged:    HH Agency:     Status of Service:  Completed, signed off  If discussed at [employer] of Stay Meetings, dates discussed:    Additional Comments:  Delrae Sawyers, RN 04/03/2017, 3:34 PM

## 2017-04-03 NOTE — Discharge Summary (Signed)
Physician Discharge Summary  Eugene Harding  KGY:[id number]  DOB: April 01, 1961  DOA: 04/02/2017 PCP: Monico Blitz, MD  Admit date: 04/02/2017 Discharge date: 04/03/2017  Admitted From: Home  Disposition:  Home   Recommendations for Outpatient Follow-up:  1. Follow up with PCP in 1-2 weeks 2. Neurology follow up in 6 week  3. Please arrange for 30 days cardiac monitor  4. Outpatient PT - Referral made for [facility] rehab  5. May need nerve conduction studies   Discharge Condition: Stable  CODE STATUS: Full code  Diet recommendation: Heart Healthy   Brief/Interim Summary: For full details see H&P/progress note but in brief Eugene Harding is a 56 year old male with a medical history of diabetes mellitus, hypertension, tobacco abuse who presented to the emergency department complaining of dizziness and left arm numbness.  Patient has been seen by neurologist for dizziness and stroke workup.  Patient had an MRI at [facility] which showed a 3 chronic strokes. Upon ED evaluation MRI of the brain showed 50 mm acute ischemic infarct involving the left lateral thalamus/posterior limb of the left internal capsule.  CTA of the head and neck did not show high-grade stenosis showed 35% stenosis of the right ICA.  Neurology was consulted and patient was admitted for working diagnosis of stroke.  Patient was also complaining of right hand numbness from the first to third third finger, felt to be carpal tunnel syndrome, splint was placed.  Patient was evaluated by neurology who recommended to place patient on aspirin and Plavix and follow-up as an outpatient.  Recommended to place a 30-day cardiac monitors as an outpatient for further management.  Subjective: Patient seen and examined, report feeling slightly better but still some dizziness and left hand numbness.  No other concerns.  Discharge Diagnoses/Hospital Course:  Stroke -small infarct left thalamus/posterior limb of the internal capsule  secondary to small vessel disease MRI head -15 mm acute ischemic infarct involving the lateral left thalamus/posterior limb of the left internal capsule MRA neck -  mild atheromatous stenosis approximately 35% at the origin of the right ICA  MRA head -not revealing Echo done on March 15, 2017 EF 50-60% with no cardiac source of emboli identified LDL - 84, triglycerides - 260 - Discharged on Lipitor 80 mg  A1c 8.1 Patient was not taking aspirin prior to admission as was recommended.  Advised to take aspirin 81 mg daily and Plavix 75 mg daily for 3 months then Plavix only Follow-up with neurologist in 6 weeks Outpatient PT Case discussed with neurology recommending follow-up as an outpatient and 30 days cardiac monitor  Hypertension Allow permissive hypertension, BP will stabilize in the next 5-7 days Resume oral hypertensive tenderness 48 hours Follow-up with PCP  Diabetes mellitus type 2 uncontrolled with hyperglycemia Patient taking metformin unforeseen at home  Advised to follow-up with PCP for tighter control. A1c 8.1 goal < 7  Tobacco abuse Smoking cessation discussed   Left arm numbness - felt to be secondary to carpal tunnel syndrome Had an x-ray negative Apply splint when sleeping May need nerve conduction study  All other chronic medical condition were stable during the hospitalization.  Patient was seen by physical therapy, OP rehab  On the day of the discharge the patient's vitals were stable, and no other acute medical condition were reported by patient. the patient was felt safe to be discharge to home.   Discharge Instructions  You were cared for by a hospitalist during your hospital stay. If you  have any questions about your discharge medications or the care you received while you were in the hospital after you are discharged, you can call the unit and asked to speak with the hospitalist on call if the hospitalist that took care of you is not available. Once you  are discharged, your primary care physician will handle any further medical issues. Please note that NO REFILLS for any discharge medications will be authorized once you are discharged, as it is imperative that you return to your primary care physician (or establish a relationship with a primary care physician if you do not have one) for your aftercare needs so that they can reassess your need for medications and monitor your lab values.  Discharge Instructions    Ambulatory referral to Physical Therapy    Complete by:  As directed    Diet - low sodium heart healthy    Complete by:  As directed    Increase activity slowly    Complete by:  As directed      Allergies as of 04/03/2017   No Known Allergies     Medication List    STOP taking these medications   amLODipine 10 MG tablet Commonly known as:  NORVASC   lisinopril 40 MG tablet Commonly known as:  PRINIVIL,ZESTRIL     TAKE these medications   aspirin 81 MG chewable tablet Chew 1 tablet (81 mg total) by mouth daily. What changed:  Another medication with the same name was removed. Continue taking this medication, and follow the directions you see here.   atorvastatin 80 MG tablet Commonly known as:  LIPITOR Take 0.5 tablets (40 mg total) by mouth daily at 6 PM.   clopidogrel 75 MG tablet Commonly known as:  PLAVIX Take 1 tablet (75 mg total) by mouth daily.   FARXIGA 5 MG Tabs tablet Generic drug:  dapagliflozin propanediol Take 5 mg by mouth daily.   glucose blood test strip 1 each by Other route as needed for other. Use as instructed   metFORMIN 500 MG tablet Commonly known as:  GLUCOPHAGE Take 500 mg by mouth 2 (two) times daily with a meal.   ondansetron 4 MG disintegrating tablet Commonly known as:  ZOFRAN ODT Take 1 tablet (4 mg total) by mouth every 8 (eight) hours as needed for nausea or vomiting. Or dizziness.   ONETOUCH DELICA LANCETS FINE Misc USE TO TEST BLOOD SUGAR ONCE DAILY   ONETOUCH VERIO  w/Device Kit USE TO TEST BLOOD SUGAR       No Known Allergies  Consultations:  Neurology   Procedures/Studies: Ct Head Wo Contrast  Result Date: 04/02/2017 CLINICAL DATA:  Increasing left arm numbness over the past 3 months. EXAM: CT HEAD WITHOUT CONTRAST TECHNIQUE: Contiguous axial images were obtained from the base of the skull through the vertex without intravenous contrast. COMPARISON:  Brain MRI 02/12/2017. FINDINGS: Brain: Appears normal without hemorrhage, infarct, mass lesion, mass effect, midline shift or abnormal extra-axial fluid collection. No hydrocephalus or pneumocephalus. Vascular: Extensive atherosclerosis is seen. Skull: Intact. Sinuses/Orbits: Mild mucosal thickening is seen in scattered paranasal sinuses and ethmoid air cells. Other: None. IMPRESSION: No acute intracranial abnormality. Extensive atherosclerosis. Mild sinus disease. Electronically Signed   By: Inge Rise M.D.   On: 04/02/2017 10:40   Mr Eugene Harding Head Wo Contrast  Result Date: 04/02/2017 CLINICAL DATA:  Initial evaluation for chronic dizziness, blurry vision, new onset left hand weakness and dizziness. EXAM: MRI HEAD WITHOUT CONTRAST MRA HEAD WITHOUT CONTRAST MRA NECK  WITHOUT AND WITH CONTRAST TECHNIQUE: Multiplanar, multiecho pulse sequences of the brain and surrounding structures were obtained without intravenous contrast. Angiographic images of the Circle of Willis were obtained using MRA technique without intravenous contrast. Angiographic images of the neck were obtained using MRA technique without and with intravenous contrast. Carotid stenosis measurements (when applicable) are obtained utilizing NASCET criteria, using the distal internal carotid diameter as the denominator. CONTRAST:  86m MULTIHANCE GADOBENATE DIMEGLUMINE 529 MG/ML IV SOLN COMPARISON:  Prior CT from earlier same day as well as previous MRI from 02/12/2017. FINDINGS: MRI HEAD FINDINGS Stable atrophy. Patchy T2/FLAIR hyperintensity  within the periventricular and deep white matter both cerebral hemispheres most consistent with chronic small vessel ischemic disease, mild in nature. Few scatter remote lacunar infarcts present within the periventricular white matter of the corona radiata bilaterally. Small remote left cerebellar infarct. There is a 15 mm focus of curvilinear diffusion abnormality at the lateral left thalamus/ posterior limb of the left internal capsule (series 5, image 15). Associated T2/FLAIR signal abnormality with minimally decreased ADC signal. Associated faint enhancement on post contrast sequence (series 17, image 25). Finding consistent with a subacute ischemic infarct. No associated hemorrhage or mass effect. Additional punctate focus of diffusion abnormality within the periventricular white matter of the left corona radiata favored to reflect T2 shine through (series 5, image 19). No other evidence for acute or subacute ischemia. Gray-white matter differentiation otherwise maintained. No evidence for acute or chronic intracranial hemorrhage. No mass lesion, midline shift or mass effect. No hydrocephalus. No extra-axial fluid collection. Major dural sinuses grossly patent. No other abnormal enhancement within the brain. Pituitary gland and suprasellar region normal. Midline structures intact and normal. Major intracranial vascular flow voids maintained. Craniocervical junction normal. Degenerative spondylolysis noted at C3-4 with resultant mild spinal stenosis. Remainder the visualized upper cervical spine otherwise unremarkable. Bone marrow signal intensity within normal limits. No scalp soft tissue abnormality. Globes and orbital soft tissues within normal limits. Scattered mucosal thickening throughout the paranasal sinuses. Retained secretions present within the right sphenoid sinus. No mastoid effusion. Inner ear structures grossly normal. MRA HEAD FINDINGS ANTERIOR CIRCULATION: Distal cervical segments of the  internal carotid arteries are widely patent with antegrade flow. Petrous, cavernous, and supraclinoid segments patent without flow-limiting stenosis. ICA termini widely patent. A1 segments, anterior communicating artery, and anterior cerebral arteries patent to their distal aspects. M1 segments patent without stenosis. Normal MCA bifurcations. No proximal M2 occlusion or stenosis. Distal MCA branches well perfused and symmetric. Distal small vessel atheromatous irregularity. POSTERIOR CIRCULATION: Right vertebral artery dominant and widely patent in the vertebrobasilar junction. Left vertebral artery hypoplastic. Atheromatous irregularity with mild stenosis within the distal left V4 segment. Posterior inferior cerebral arteries patent proximally. Basilar artery mildly irregular without flow-limiting stenosis. Superior cerebral arteries patent bilaterally. Right PCA supplied via the basilar. Fetal type origin of the left PCA supplied via a widely patent left posterior communicating artery. PCAs patent to their distal aspects without flow-limiting stenosis. Distal small vessel atheromatous irregularity. No aneurysm or vascular malformation. MRA NECK FINDINGS Source images reviewed. Visualized aortic arch of normal caliber with normal branch pattern. No flow-limiting stenosis seen about the origin of the great vessels. Visualized subclavian artery is widely patent. Right common carotid artery patent from its origin to the bifurcation without flow-limiting stenosis. Atheromatous irregularity with relatively mild narrowing of approximately 35% at the origin of the right ICA. Right ICA patent distally to the skullbase without additional stenosis or other acute abnormality. Left common carotid artery  patent from its origin to the bifurcation without stenosis. No significant atheromatous narrowing about the left carotid bifurcation. Left ICA patent distally to the skullbase without stenosis or occlusion. Both of the  vertebral arteries arise from the subclavian arteries. Right vertebral artery dominant. Diffusely hypoplastic left vertebral artery. Vertebral arteries mildly irregular but patent within the neck without hemodynamically significant stenosis. IMPRESSION: MRI HEAD IMPRESSION: 1. 15 mm subacute ischemic infarct involving the lateral left thalamus/posterior limb of the left internal capsule. No associated hemorrhage or mass effect. 2. Otherwise stable atrophy with chronic ischemic changes. MRA HEAD IMPRESSION: 1. Negative intracranial MRA for large vessel occlusion. No high-grade or correctable stenosis. 2. Distal small vessel atheromatous irregularity involving both the anterior and posterior circulations. MRA NECK IMPRESSION: 1. Mild atheromatous stenosis of approximately 35% at the origin of the right ICA. Right carotid artery system otherwise widely patent. 2. Widely patent left carotid artery system without hemodynamically significant stenosis. 3. Widely patent vertebral arteries within the neck. Right vertebral artery dominant. Electronically Signed   By: Jeannine Boga M.D.   On: 04/02/2017 20:28   Mr Eugene Harding Neck W Wo Contrast  Result Date: 04/02/2017 CLINICAL DATA:  Initial evaluation for chronic dizziness, blurry vision, new onset left hand weakness and dizziness. EXAM: MRI HEAD WITHOUT CONTRAST MRA HEAD WITHOUT CONTRAST MRA NECK WITHOUT AND WITH CONTRAST TECHNIQUE: Multiplanar, multiecho pulse sequences of the brain and surrounding structures were obtained without intravenous contrast. Angiographic images of the Circle of Willis were obtained using MRA technique without intravenous contrast. Angiographic images of the neck were obtained using MRA technique without and with intravenous contrast. Carotid stenosis measurements (when applicable) are obtained utilizing NASCET criteria, using the distal internal carotid diameter as the denominator. CONTRAST:  44m MULTIHANCE GADOBENATE DIMEGLUMINE 529 MG/ML  IV SOLN COMPARISON:  Prior CT from earlier same day as well as previous MRI from 02/12/2017. FINDINGS: MRI HEAD FINDINGS Stable atrophy. Patchy T2/FLAIR hyperintensity within the periventricular and deep white matter both cerebral hemispheres most consistent with chronic small vessel ischemic disease, mild in nature. Few scatter remote lacunar infarcts present within the periventricular white matter of the corona radiata bilaterally. Small remote left cerebellar infarct. There is a 15 mm focus of curvilinear diffusion abnormality at the lateral left thalamus/ posterior limb of the left internal capsule (series 5, image 15). Associated T2/FLAIR signal abnormality with minimally decreased ADC signal. Associated faint enhancement on post contrast sequence (series 17, image 25). Finding consistent with a subacute ischemic infarct. No associated hemorrhage or mass effect. Additional punctate focus of diffusion abnormality within the periventricular white matter of the left corona radiata favored to reflect T2 shine through (series 5, image 19). No other evidence for acute or subacute ischemia. Gray-white matter differentiation otherwise maintained. No evidence for acute or chronic intracranial hemorrhage. No mass lesion, midline shift or mass effect. No hydrocephalus. No extra-axial fluid collection. Major dural sinuses grossly patent. No other abnormal enhancement within the brain. Pituitary gland and suprasellar region normal. Midline structures intact and normal. Major intracranial vascular flow voids maintained. Craniocervical junction normal. Degenerative spondylolysis noted at C3-4 with resultant mild spinal stenosis. Remainder the visualized upper cervical spine otherwise unremarkable. Bone marrow signal intensity within normal limits. No scalp soft tissue abnormality. Globes and orbital soft tissues within normal limits. Scattered mucosal thickening throughout the paranasal sinuses. Retained secretions present  within the right sphenoid sinus. No mastoid effusion. Inner ear structures grossly normal. MRA HEAD FINDINGS ANTERIOR CIRCULATION: Distal cervical segments of the internal carotid arteries  are widely patent with antegrade flow. Petrous, cavernous, and supraclinoid segments patent without flow-limiting stenosis. ICA termini widely patent. A1 segments, anterior communicating artery, and anterior cerebral arteries patent to their distal aspects. M1 segments patent without stenosis. Normal MCA bifurcations. No proximal M2 occlusion or stenosis. Distal MCA branches well perfused and symmetric. Distal small vessel atheromatous irregularity. POSTERIOR CIRCULATION: Right vertebral artery dominant and widely patent in the vertebrobasilar junction. Left vertebral artery hypoplastic. Atheromatous irregularity with mild stenosis within the distal left V4 segment. Posterior inferior cerebral arteries patent proximally. Basilar artery mildly irregular without flow-limiting stenosis. Superior cerebral arteries patent bilaterally. Right PCA supplied via the basilar. Fetal type origin of the left PCA supplied via a widely patent left posterior communicating artery. PCAs patent to their distal aspects without flow-limiting stenosis. Distal small vessel atheromatous irregularity. No aneurysm or vascular malformation. MRA NECK FINDINGS Source images reviewed. Visualized aortic arch of normal caliber with normal branch pattern. No flow-limiting stenosis seen about the origin of the great vessels. Visualized subclavian artery is widely patent. Right common carotid artery patent from its origin to the bifurcation without flow-limiting stenosis. Atheromatous irregularity with relatively mild narrowing of approximately 35% at the origin of the right ICA. Right ICA patent distally to the skullbase without additional stenosis or other acute abnormality. Left common carotid artery patent from its origin to the bifurcation without stenosis. No  significant atheromatous narrowing about the left carotid bifurcation. Left ICA patent distally to the skullbase without stenosis or occlusion. Both of the vertebral arteries arise from the subclavian arteries. Right vertebral artery dominant. Diffusely hypoplastic left vertebral artery. Vertebral arteries mildly irregular but patent within the neck without hemodynamically significant stenosis. IMPRESSION: MRI HEAD IMPRESSION: 1. 15 mm subacute ischemic infarct involving the lateral left thalamus/posterior limb of the left internal capsule. No associated hemorrhage or mass effect. 2. Otherwise stable atrophy with chronic ischemic changes. MRA HEAD IMPRESSION: 1. Negative intracranial MRA for large vessel occlusion. No high-grade or correctable stenosis. 2. Distal small vessel atheromatous irregularity involving both the anterior and posterior circulations. MRA NECK IMPRESSION: 1. Mild atheromatous stenosis of approximately 35% at the origin of the right ICA. Right carotid artery system otherwise widely patent. 2. Widely patent left carotid artery system without hemodynamically significant stenosis. 3. Widely patent vertebral arteries within the neck. Right vertebral artery dominant. Electronically Signed   By: Jeannine Boga M.D.   On: 04/02/2017 20:28   Mr Jeri Cos And Wo Contrast  Result Date: 04/02/2017 CLINICAL DATA:  Initial evaluation for chronic dizziness, blurry vision, new onset left hand weakness and dizziness. EXAM: MRI HEAD WITHOUT CONTRAST MRA HEAD WITHOUT CONTRAST MRA NECK WITHOUT AND WITH CONTRAST TECHNIQUE: Multiplanar, multiecho pulse sequences of the brain and surrounding structures were obtained without intravenous contrast. Angiographic images of the Circle of Willis were obtained using MRA technique without intravenous contrast. Angiographic images of the neck were obtained using MRA technique without and with intravenous contrast. Carotid stenosis measurements (when applicable) are  obtained utilizing NASCET criteria, using the distal internal carotid diameter as the denominator. CONTRAST:  10m MULTIHANCE GADOBENATE DIMEGLUMINE 529 MG/ML IV SOLN COMPARISON:  Prior CT from earlier same day as well as previous MRI from 02/12/2017. FINDINGS: MRI HEAD FINDINGS Stable atrophy. Patchy T2/FLAIR hyperintensity within the periventricular and deep white matter both cerebral hemispheres most consistent with chronic small vessel ischemic disease, mild in nature. Few scatter remote lacunar infarcts present within the periventricular white matter of the corona radiata bilaterally. Small remote left cerebellar infarct.  There is a 15 mm focus of curvilinear diffusion abnormality at the lateral left thalamus/ posterior limb of the left internal capsule (series 5, image 15). Associated T2/FLAIR signal abnormality with minimally decreased ADC signal. Associated faint enhancement on post contrast sequence (series 17, image 25). Finding consistent with a subacute ischemic infarct. No associated hemorrhage or mass effect. Additional punctate focus of diffusion abnormality within the periventricular white matter of the left corona radiata favored to reflect T2 shine through (series 5, image 19). No other evidence for acute or subacute ischemia. Gray-white matter differentiation otherwise maintained. No evidence for acute or chronic intracranial hemorrhage. No mass lesion, midline shift or mass effect. No hydrocephalus. No extra-axial fluid collection. Major dural sinuses grossly patent. No other abnormal enhancement within the brain. Pituitary gland and suprasellar region normal. Midline structures intact and normal. Major intracranial vascular flow voids maintained. Craniocervical junction normal. Degenerative spondylolysis noted at C3-4 with resultant mild spinal stenosis. Remainder the visualized upper cervical spine otherwise unremarkable. Bone marrow signal intensity within normal limits. No scalp soft tissue  abnormality. Globes and orbital soft tissues within normal limits. Scattered mucosal thickening throughout the paranasal sinuses. Retained secretions present within the right sphenoid sinus. No mastoid effusion. Inner ear structures grossly normal. MRA HEAD FINDINGS ANTERIOR CIRCULATION: Distal cervical segments of the internal carotid arteries are widely patent with antegrade flow. Petrous, cavernous, and supraclinoid segments patent without flow-limiting stenosis. ICA termini widely patent. A1 segments, anterior communicating artery, and anterior cerebral arteries patent to their distal aspects. M1 segments patent without stenosis. Normal MCA bifurcations. No proximal M2 occlusion or stenosis. Distal MCA branches well perfused and symmetric. Distal small vessel atheromatous irregularity. POSTERIOR CIRCULATION: Right vertebral artery dominant and widely patent in the vertebrobasilar junction. Left vertebral artery hypoplastic. Atheromatous irregularity with mild stenosis within the distal left V4 segment. Posterior inferior cerebral arteries patent proximally. Basilar artery mildly irregular without flow-limiting stenosis. Superior cerebral arteries patent bilaterally. Right PCA supplied via the basilar. Fetal type origin of the left PCA supplied via a widely patent left posterior communicating artery. PCAs patent to their distal aspects without flow-limiting stenosis. Distal small vessel atheromatous irregularity. No aneurysm or vascular malformation. MRA NECK FINDINGS Source images reviewed. Visualized aortic arch of normal caliber with normal branch pattern. No flow-limiting stenosis seen about the origin of the great vessels. Visualized subclavian artery is widely patent. Right common carotid artery patent from its origin to the bifurcation without flow-limiting stenosis. Atheromatous irregularity with relatively mild narrowing of approximately 35% at the origin of the right ICA. Right ICA patent distally to  the skullbase without additional stenosis or other acute abnormality. Left common carotid artery patent from its origin to the bifurcation without stenosis. No significant atheromatous narrowing about the left carotid bifurcation. Left ICA patent distally to the skullbase without stenosis or occlusion. Both of the vertebral arteries arise from the subclavian arteries. Right vertebral artery dominant. Diffusely hypoplastic left vertebral artery. Vertebral arteries mildly irregular but patent within the neck without hemodynamically significant stenosis. IMPRESSION: MRI HEAD IMPRESSION: 1. 15 mm subacute ischemic infarct involving the lateral left thalamus/posterior limb of the left internal capsule. No associated hemorrhage or mass effect. 2. Otherwise stable atrophy with chronic ischemic changes. MRA HEAD IMPRESSION: 1. Negative intracranial MRA for large vessel occlusion. No high-grade or correctable stenosis. 2. Distal small vessel atheromatous irregularity involving both the anterior and posterior circulations. MRA NECK IMPRESSION: 1. Mild atheromatous stenosis of approximately 35% at the origin of the right ICA. Right carotid artery  system otherwise widely patent. 2. Widely patent left carotid artery system without hemodynamically significant stenosis. 3. Widely patent vertebral arteries within the neck. Right vertebral artery dominant. Electronically Signed   By: Jeannine Boga M.D.   On: 04/02/2017 20:28   Dg Hand 2 View Left  Result Date: 04/03/2017 CLINICAL DATA:  56 year old male with pain and numbness to left thumb, index and middle fingers for 3 months. No known injury. EXAM: LEFT HAND - 2 VIEW COMPARISON:  None. FINDINGS: There is no evidence of fracture or dislocation. There is no evidence of arthropathy or other focal bone abnormality. Soft tissues are unremarkable. IMPRESSION: Negative. Electronically Signed   By: Eugene Harding M.D.   On: 04/03/2017 11:51    Discharge Exam: Vitals:    04/03/17 0940 04/03/17 1501  BP: 128/61 (!) 142/67  Pulse: 86 95  Resp: 16 19  Temp: 99.1 F (37.3 C) 97.6 F (36.4 C)  SpO2: 98% 96%   Vitals:   04/03/17 0300 04/03/17 0500 04/03/17 0940 04/03/17 1501  BP: 122/61 135/62 128/61 (!) 142/67  Pulse: 74 76 86 95  Resp: 17 16 16 19   Temp:  98.2 F (36.8 C) 99.1 F (37.3 C) 97.6 F (36.4 C)  TempSrc:  Oral Oral Oral  SpO2: 97% 97% 98% 96%  Weight:      Height:        General: Pt is alert, awake, not in acute distress Cardiovascular: RRR, S1/S2 +, no rubs, no gallops Respiratory: CTA bilaterally, no wheezing, no rhonchi Abdominal: Soft, NT, ND, bowel sounds + Extremities: no edema, no cyanosis   The results of significant diagnostics from this hospitalization (including imaging, microbiology, ancillary and laboratory) are listed below for reference.     Microbiology: No results found for this or any previous visit (from the past 240 hour(s)).   Labs: BNP (last 3 results) No results for input(s): BNP in the last 8760 hours. Basic Metabolic Panel:  Recent Labs Lab 04/02/17 1010 04/02/17 1041  NA 139 141  K 5.0 4.4  CL 107 105  CO2 23  --   GLUCOSE 153* 149*  BUN 17 18  CREATININE 1.05 1.00  CALCIUM 9.3  --    Liver Function Tests:  Recent Labs Lab 04/02/17 1010  AST 33  ALT 41  ALKPHOS 76  BILITOT 1.6*  PROT 6.5  ALBUMIN 4.0   No results for input(s): LIPASE, AMYLASE in the last 168 hours. No results for input(s): AMMONIA in the last 168 hours. CBC:  Recent Labs Lab 04/02/17 1010 04/02/17 1041  WBC 8.5  --   NEUTROABS 5.0  --   HGB 15.9 16.0  HCT 46.9 47.0  MCV 95.3  --   PLT 102*  --    Cardiac Enzymes: No results for input(s): CKTOTAL, CKMB, CKMBINDEX, TROPONINI in the last 168 hours. BNP: Invalid input(s): POCBNP CBG:  Recent Labs Lab 04/02/17 2301 04/03/17 0832 04/03/17 1317  GLUCAP 206* 153* 200*   D-Dimer No results for input(s): DDIMER in the last 72 hours. Hgb  A1c  Recent Labs  04/02/17 1010  HGBA1C 8.1*   Lipid Profile  Recent Labs  04/02/17 1010  CHOL 168  HDL 32*  LDLCALC 84  TRIG 260*  CHOLHDL 5.3   Thyroid function studies No results for input(s): TSH, T4TOTAL, T3FREE, THYROIDAB in the last 72 hours.  Invalid input(s): FREET3 Anemia work up No results for input(s): VITAMINB12, FOLATE, FERRITIN, TIBC, IRON, RETICCTPCT in the last 72 hours. Urinalysis No results  found for: COLORURINE, APPEARANCEUR, [place], [place], GLUCOSEU, HGBUR, BILIRUBINUR, KETONESUR, PROTEINUR, UROBILINOGEN, NITRITE, LEUKOCYTESUR Sepsis Labs Invalid input(s): PROCALCITONIN,  WBC,  LACTICIDVEN Microbiology No results found for this or any previous visit (from the past 240 hour(s)).   Time coordinating discharge: 30 minutes  SIGNED:  Chipper Oman, MD  [facility] 04/03/2017, 3:24 PM  Pager please text page via  [email or web address] Password [email or web address]

## 2017-04-03 NOTE — Progress Notes (Addendum)
STROKE TEAM PROGRESS NOTE   HISTORY OF PRESENT ILLNESS (per record) Eugene Harding is an 56 y.o. male who is well know to Dr. Jaynee Eagles for previous strokes and chronic dizziness and blurred vision.  "Patient with acute onset left hand numbness and dizziness. MRI at [facility] showed 3 chronic strokes. I suspect he had a brainstem stroke too small to be seen on MRI brain. He has 3 chronic lacunar infarcts and I don't think any one of them would explain all his symptoms but he does have a right thalamic stroke (numbness in left hand) and a cerebellar stroke (dizziness) but these would have to happen at the same time and they look ischemic not embolic. "   Currently he feels his symptoms have worsened over the last 4 days.  For this reason he will obtain MRI/MRA brain and neck.   Date last known well: Unable to determine Time last known well: Unable to determine tPA Given: No: out of window Modified Rankin: Rankin Score=0   SUBJECTIVE (INTERVAL HISTORY) No family is at the bedside.  Patient lying in bed, not in acute distress.  He stated that he still has left whole hand numbness, both radial and ulnar sides, no difference.  Both anterior and the posterior of the palm and in all fingers.  He still has dizziness, which has been worse for the last 3 days, no significant improvement.  However no fall, working with PT/OT in the hallway and up and down stairs no problem.   OBJECTIVE Temp:  [98.2 F (36.8 C)-99.1 F (37.3 C)] 99.1 F (37.3 C) (11/03 0940) Pulse Rate:  [73-94] 86 (11/03 0940) Cardiac Rhythm: Normal sinus rhythm (11/03 0807) Resp:  [15-21] 16 (11/03 0500) BP: (119-161)/(61-99) 128/61 (11/03 0940) SpO2:  [95 %-100 %] 98 % (11/03 0940) Weight:  [195 lb 4.8 oz (88.6 kg)] 195 lb 4.8 oz (88.6 kg) (11/02 2252)  CBC:   Recent Labs Lab 04/02/17 1010 04/02/17 1041  WBC 8.5  --   NEUTROABS 5.0  --   HGB 15.9 16.0  HCT 46.9 47.0  MCV 95.3  --   PLT 102*  --     Basic  Metabolic Panel:   Recent Labs Lab 04/02/17 1010 04/02/17 1041  NA 139 141  K 5.0 4.4  CL 107 105  CO2 23  --   GLUCOSE 153* 149*  BUN 17 18  CREATININE 1.05 1.00  CALCIUM 9.3  --     Lipid Panel:     Component Value Date/Time   CHOL 168 04/02/2017 1010   TRIG 260 (H) 04/02/2017 1010   HDL 32 (L) 04/02/2017 1010   CHOLHDL 5.3 04/02/2017 1010   VLDL 52 (H) 04/02/2017 1010   LDLCALC 84 04/02/2017 1010   HgbA1c:  Lab Results  Component Value Date   HGBA1C 8.1 (H) 04/02/2017   Urine Drug Screen:     Component Value Date/Time   LABOPIA NONE DETECTED 04/03/2017 0000   COCAINSCRNUR NONE DETECTED 04/03/2017 0000   LABBENZ NONE DETECTED 04/03/2017 0000   AMPHETMU NONE DETECTED 04/03/2017 0000   THCU NONE DETECTED 04/03/2017 0000   LABBARB NONE DETECTED 04/03/2017 0000    Alcohol Level No results found for: [place] I have personally reviewed the radiological images below and agree with the radiology interpretations.  Ct Head Wo Contrast 04/02/2017 IMPRESSION:  No acute intracranial abnormality. Extensive atherosclerosis. Mild sinus disease.   Mr [facility] and Neck Wo Contrast  04/02/2017 MRI HEAD  1.  15 mm subacute ischemic infarct involving the lateral left thalamus/posterior limb of the left internal capsule. No associated hemorrhage or mass effect.  2. Otherwise stable atrophy with chronic ischemic changes.   MRA HEAD  1. Negative intracranial MRA for large vessel occlusion. No high-grade or correctable stenosis.  2. Distal small vessel atheromatous irregularity involving both the anterior and posterior circulations.   MRA NECK  1. Mild atheromatous stenosis of approximately 35% at the origin of the right ICA. Right carotid artery system otherwise widely patent.  2. Widely patent left carotid artery system without hemodynamically significant stenosis.  3. Widely patent vertebral arteries within the neck. Right vertebral artery dominant.   Transthoracic  Echocardiogram  03/15/2017 Study Conclusions - Left ventricle: The cavity size was normal. Wall thickness was   increased in a pattern of mild LVH. Systolic function was normal.   The estimated ejection fraction was in the range of 55% to 60%.   Wall motion was normal; there were no regional wall motion   abnormalities. Doppler parameters are consistent with abnormal   left ventricular relaxation (grade 1 diastolic dysfunction). - Aortic valve: Trileaflet; mildly calcified leaflets. - Mitral valve: Mildly calcified annulus. There was trivial   regurgitation. - Right atrium: Central venous pressure (est): 3 mm Hg. - Atrial septum: No defect or patent foramen ovale was identified. - Tricuspid valve: There was trivial regurgitation. - Pulmonary arteries: Systolic pressure could not be accurately   estimated. - Pericardium, extracardiac: There was no pericardial effusion. Impressions: - Mild LVH with LVEF 55-60% and grade 1 diastolic dysfunction.   Mildly calcified mitral annulus with trivial mitral   regurgitation. Mildly calcified aortic valve. Trivial tricuspid   regurgitation.   PHYSICAL EXAM Vitals:   04/03/17 0100 04/03/17 0300 04/03/17 0500 04/03/17 0940  BP: 119/61 122/61 135/62 128/61  Pulse: 73 74 76 86  Resp: 16 17 16    Temp: 98.3 F (36.8 C)  98.2 F (36.8 C) 99.1 F (37.3 C)  TempSrc: Oral  Oral Oral  SpO2: 98% 97% 97% 98%  Weight:      Height:        Temp:  [97.6 F (36.4 C)-99.1 F (37.3 C)] 97.6 F (36.4 C) (11/03 1501) Pulse Rate:  [73-95] 95 (11/03 1501) Resp:  [15-20] 19 (11/03 1501) BP: (119-161)/(61-99) 142/67 (11/03 1501) SpO2:  [95 %-98 %] 96 % (11/03 1501) Weight:  [195 lb 4.8 oz (88.6 kg)] 195 lb 4.8 oz (88.6 kg) (11/02 2252)  General - Well nourished, well developed, in no apparent distress.  Ophthalmologic - Fundi not visualized due to noncooperation.  Cardiovascular - Regular rate and rhythm.  Mental Status -  Level of arousal and  orientation to time, place, and person were intact. Language including expression, naming, repetition, comprehension was assessed and found intact. Attention span and concentration were normal. Fund of Knowledge was assessed and was intact.  Cranial Nerves II - XII - II - Visual field examination showed right upper quadrantanopsia III, IV, VI - Extraocular movements intact. V - Facial sensation intact bilaterally. VII - right nasolabial fold flattening. VIII - Hearing & vestibular intact bilaterally. X - Palate elevates symmetrically. XI - Chin turning & shoulder shrug intact bilaterally. XII - Tongue protrusion intact.  Motor Strength - The patient's strength was normal in all extremities and pronator drift was absent.  Bulk was normal and fasciculations were absent.   Motor Tone - Muscle tone was assessed at the neck and appendages and was normal.  Reflexes -  The patient's reflexes were 1+ in all extremities and he had no pathological reflexes.  Sensory - Light touch, temperature/pinprick were assessed and were decreased on the left entire hand.    Coordination - The patient had normal movements in the hands with no ataxia or dysmetria.  Tremor was absent.  Gait and Station - deferred   ASSESSMENT/PLAN Eugene Harding is a 56 y.o. male with history of previous strokes, hypertension, medical noncompliance, tobacco use, diabetes mellitus, chronic dizziness, and chronic blurred vision, presenting with worsening dizziness, blurred vision and left hand numbness. He did not receive IV t-PA due to late presentation.  Stroke: Small infarct left thalamus/posterior limb of the left internal capsule secondary to small vessel disease.  Stroke risk factors including hypertension, hyperlipidemia, uncontrolled diabetes, possible OSA, obesity.  Resultant continued dizziness and blurry vision and left hand numbness  CT head - No acute intracranial abnormality.  MRI head - 15 mm subacute  ischemic infarct involving the lateral L thalamus/posterior limb of the L internal capsule.   MRA head - Negative intracranial MRA.  Left fetal PCA  MRA neck - Mild atheromatous stenosis of approximately 35% at the origin of the right ICA.  2D Echo - EF 55-60%. No cardiac source of emboli identified.  LDL - 84  HgbA1c - 8.1  VTE prophylaxis - SCDs  Diet heart healthy/carb modified Room service appropriate? Yes; Fluid consistency: Thin  aspirin 325 mg daily prior to admission, now on aspirin 325 mg daily.  Recommend DAPT for 3 months, then Plavix alone based on POINT trial.  Patient counseled to be compliant with his antithrombotic medications  Ongoing aggressive stroke risk factor management  Therapy recommendations: - Outpatient PT OT  Disposition: Pending  History of stroke  Patient stated that he is symptoms starting from end of 12/2016  With dizziness, left hand numbness  MRI 02/12/17 - subacute right CR/caudate, chronic left cerebellum, right thalamic lacunar infarcts  EF 55-60% on 03/15/17  Follow with Dr. Jaynee Eagles on 03/25/17  Left hand numbness  Left whole hand numbness pattern does not consistent with carpal tunnel syndrome  Denies right hand numbness, making diabetic polyneuropathy atypical  Recommend outpatient EMG/NCS  Hypertension  Stable, on the high side  Permissive hypertension (OK if < 220/120) but gradually normalize in 5-7 days  Long-term BP goal normotensive  Hyperlipidemia  Home meds: No lipid lowering medications prior to admission  LDL 84, goal < 70  Now on Lipitor 40 mg daily  Continue statin at discharge  Diabetes  HgbA1c 8.1, goal < 7.0  Uncontrolled  SSI  CBG monitoring  Close PCP follow-up  Tobacco abuse  Current smoker  Smoking cessation counseling provided  Pt is willing to quit  Other Stroke Risk Factors  ETOH use, advised to drink no more than 1 drink per day.  Obesity, Body mass index is 33.52 kg/m.,  recommend weight loss, diet and exercise as appropriate   Hx stroke/TIA  Medical noncompliance  S/s of OSA - will do sleep study as outpatient  Other Active Problems  Mild thrombocytopenia - [facility] day # 0   Neurology will sign off. Please call with questions. Pt will follow up with Cecille Rubin, NP, at [facility] on 06/28/2017 as previously scheduled.  Thanks for the consult   Rosalin Hawking, MD PhD Stroke Neurology 04/03/2017 4:09 PM    To contact Stroke Continuity provider, please refer to [email or web address] After hours, contact General Neurology

## 2017-04-03 NOTE — Progress Notes (Signed)
OT Evaluation  PTA, pt lived at home alone. States he has been experiencing dizziness x 3 months and hasn't been able to work. Pt is not currently driving due to "dizziness". Pt with L hand weakness and decreased sensation. Pt began L hand strengthening and fine motor coordination HEP. Will follow up prior to DC. Recommend follow up with outpt OT at a Neuro outpt center.    04/03/17 1400  OT Visit Information  Last OT Received On 04/03/17  Assistance Needed +1  History of Present Illness Pt is a 56 y/o male admitted secondary to to dizziness and L UE numbness. MRI revealed a 29mm subacute ischemic infarct R thalamus. PMH including but not limited to HTN, DM and tobacco use.  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  tinel's sign - pt states "maybe a little more tingling" Using functionally but note  decreased in hand manipulation skills  LUE Sensation decreased light touch  LUE Coordination decreased fine motor  Lower Extremity Assessment  Lower Extremity Assessment Defer to PT evaluation  Cervical / Trunk Assessment  Cervical / Trunk Assessment Normal  ADL  Overall ADL's  Needs assistance/impaired  Functional mobility during ADLs Supervision/safety  General ADL Comments Pt ableto complete basic ADL tasks; states cooking. yarwork and other IADL tasks are more difficult to complete  Vision- History  Baseline Vision/History Wears glasses  Wears Glasses Distance only  Patient Visual Report Blurring of vision  Vision- Assessment  Vision Assessment? Yes  Eye Alignment WFL  Ocular Range of Motion [facility]  Alignment/Gaze Preference WDL  Tracking/Visual Pursuits Decreased smoothness of horizontal tracking;Decreased smoothness of vertical tracking  [place]  Convergence WFL  Additional Comments complains of increased "dizziness" wtih eye movemetn  Perception  Comments WFL  Praxis  Praxis tested? WFL  Bed Mobility  Overal bed mobility Modified Independent  Transfers  Overall transfer level Needs assistance  Equipment used None  Transfers Sit to/from Stand  Sit to Stand Supervision  General transfer comment for safety  Balance  Overall balance assessment Needs assistance  Sitting-balance support Feet supported  Sitting balance-Leahy Scale Normal  Standing balance support During functional activity;No upper extremity supported  Standing balance-Leahy Scale Good  Exercises  Exercises Other exercises  Other Exercises  Other Exercises level 2 theraputty  Other Exercises fine motor coordination handout  OT - End of Session  Activity Tolerance Patient tolerated treatment well  Patient left in bed;with call bell/phone within reach;with family/visitor present  Nurse Communication Mobility status  OT Assessment  OT Recommendation/Assessment Patient needs continued OT Services  OT Visit Diagnosis Dizziness and giddiness (R42);Muscle weakness (generalized) (M62.81)  OT Problem List Decreased strength;Impaired vision/perception;Decreased safety awareness;Decreased coordination;Obesity;Impaired UE functional use  OT Plan  OT Frequency (ACUTE ONLY) Min 2X/week  OT Treatment/Interventions (ACUTE ONLY) Self-care/ADL training;Therapeutic exercise;Neuromuscular education;DME and/or AE instruction;Therapeutic activities;Cognitive remediation/compensation;Patient/family education  AM-PAC OT "6 Clicks" Daily Activity Outcome Measure  Help from another person eating meals? 4  Help from another person taking care of personal grooming? 4  Help from another person toileting, which includes using toliet, bedpan, or urinal? 4  Help from another person bathing (including washing, rinsing, drying)? 4  Help from another person to put on and taking off regular upper body clothing? 4  Help from another person to put on and taking off regular lower body clothing? 4  6 Click Score 24  ADL G Code Conversion CH  OT Recommendation  Follow Up Recommendations Outpatient OT;Supervision - Intermittent (neuro outpt )  OT Equipment None recommended by OT  Individuals Consulted  Consulted and Agree with Results and Recommendations Patient;Family member/caregiver  Family Member Consulted daughter  Acute Rehab OT Goals  Patient Stated Goal return home  OT Goal Formulation With patient  Time For Goal Achievement 04/17/17  Potential to Achieve Goals Good  OT Time Calculation  OT Start Time (ACUTE ONLY) 1415  OT Stop Time (ACUTE ONLY) 1437  OT Time Calculation (min) 22 min  OT G-codes **NOT FOR INPATIENT CLASS**  Functional Assessment Tool Used Clinical judgement  Functional Limitation Self care  Self Care Current Status (Y4034) CI  Self Care Goal Status (V4259) CI  OT General Charges  $OT Visit 1 Visit  OT Evaluation  $OT Eval Low Complexity 1 Low  Written Expression  Dominant Hand  Right  [facility], OT/L  [phone] 04/03/2017

## 2017-04-03 NOTE — Progress Notes (Signed)
Discharge teaching given to patient, educated and verbalized understanding of teaching and discharge instructions. Patient calm and stable, no new concerns at this time.

## 2017-04-03 NOTE — Telephone Encounter (Signed)
Called patient to discuss, left message. I spoke with Dr Erlinda Hong today about his case, Dr. Erlinda Hong will be reviewing everything with him inpatient including his new stroke, management going forward so we will see him in follow up in January in the office.

## 2017-04-03 NOTE — Evaluation (Addendum)
Physical Therapy Evaluation & Discharge Patient Details Name: Eugene Harding MRN: [id number] DOB: [birth year] Today's Date: 04/03/2017   History of Present Illness  Pt is a 56 y/o male admitted secondary to to dizziness and L UE numbness. MRI revealed a 62m subacute ischemic infarct. PMH including but not limited to HTN, DM and tobacco use.  Clinical Impression  Pt presented supine in bed with HOB elevated, awake and willing to participate in therapy session. Prior to admission, pt reported that he was independent with all functional mobility and ADLs. Pt lives alone and has family/friends that can provide supervision/assistance PRN. Pt continues to work full-time. Pt ambulated in hallway without use of an AD with supervision for safety. Pt also successfully completed stair training without any issues. Pt with reports of +dizziness that was constant throughout and had some affect on his balance and coordination. Pt unable to fully participate in higher level balance assessment (DGI) secondary to increase in dizziness with vertical and horizontal head turns. No further acute PT needs identified at this time. Pt would benefit from further evaluation in the Outpatient setting.     Follow Up Recommendations Outpatient PT    Equipment Recommendations  None recommended by PT    Recommendations for Other Services       Precautions / Restrictions Precautions Precautions: None Restrictions Weight Bearing Restrictions: No      Mobility  Bed Mobility Overal bed mobility: Modified Independent                Transfers Overall transfer level: Needs assistance Equipment used: None Transfers: Sit to/from Stand Sit to Stand: Supervision         General transfer comment: for safety  Ambulation/Gait Ambulation/Gait assistance: Supervision Ambulation Distance (Feet): 200 Feet Assistive device: None Gait Pattern/deviations: Step-through pattern;Drifts right/left Gait velocity:  decreased Gait velocity interpretation: Below normal speed for age/gender General Gait Details: mild instability but no overt LOB, supervision for safety  Stairs Stairs: Yes Stairs assistance: Supervision Stair Management: No rails;Alternating pattern;Forwards Number of Stairs: 5 General stair comments: no instability or LOB, supervision for safety  Wheelchair Mobility    Modified Rankin (Stroke Patients Only) Modified Rankin (Stroke Patients Only) Pre-Morbid Rankin Score: No symptoms Modified Rankin: No significant disability     Balance Overall balance assessment: Needs assistance Sitting-balance support: Feet supported Sitting balance-Leahy Scale: Normal     Standing balance support: During functional activity;No upper extremity supported Standing balance-Leahy Scale: Good                               Pertinent Vitals/Pain Pain Assessment: No/denies pain    Home Living Family/patient expects to be discharged to:: Private residence Living Arrangements: Alone Available Help at Discharge: Family;Friend(s);Available PRN/intermittently Type of Home: House Home Access: Stairs to enter   [employer] Steps: 1 Home Layout: One level Home Equipment: Shower seat      Prior Function Level of Independence: Independent         Comments: works full-time     HJournalist, newspaper  Dominant Hand: Right    Extremity/Trunk Assessment   Upper Extremity Assessment Upper Extremity Assessment: Defer to OT evaluation    Lower Extremity Assessment Lower Extremity Assessment: Overall WFL for tasks assessed       Communication   Communication: No difficulties  Cognition Arousal/Alertness: Awake/alert Behavior During Therapy: WFL for tasks assessed/performed Overall Cognitive Status: Within Functional Limits for tasks assessed  General Comments      Exercises     Assessment/Plan     PT Assessment All further PT needs can be met in the next venue of care  PT Problem List Decreased balance;Decreased mobility;Decreased coordination       PT Treatment Interventions      PT Goals (Current goals can be found in the Care Plan section)  Acute Rehab PT Goals Patient Stated Goal: return home    Frequency     Barriers to discharge        Co-evaluation               AM-PAC PT "6 Clicks" Daily Activity  Outcome Measure Difficulty turning over in bed (including adjusting bedclothes, sheets and blankets)?: None Difficulty moving from lying on back to sitting on the side of the bed? : None Difficulty sitting down on and standing up from a chair with arms (e.g., wheelchair, bedside commode, etc,.)?: None Help needed moving to and from a bed to chair (including a wheelchair)?: None Help needed walking in hospital room?: None Help needed climbing 3-5 steps with a railing? : None 6 Click Score: 24    End of Session Equipment Utilized During Treatment: Gait belt Activity Tolerance: Patient tolerated treatment well Patient left: in bed;with call bell/phone within reach Nurse Communication: Mobility status PT Visit Diagnosis: Other abnormalities of gait and mobility (R26.89);Other symptoms and signs involving the nervous system (R29.898)    Time: 7944-4619 PT Time Calculation (min) (ACUTE ONLY): 14 min   Charges:   PT Evaluation $PT Eval Moderate Complexity: 1 Mod     PT G Codes:   PT G-Codes **NOT FOR INPATIENT CLASS** Functional Assessment Tool Used: AM-PAC 6 Clicks Basic Mobility;Clinical judgement Functional Limitation: Mobility: Walking and moving around Mobility: Walking and Moving Around Current Status (U1222): 0 percent impaired, limited or restricted Mobility: Walking and Moving Around Goal Status (I1146): 0 percent impaired, limited or restricted Mobility: Walking and Moving Around Discharge Status [phone]): 0 percent impaired, limited or restricted     [facility], PT, DPT [place] 04/03/2017, 11:57 AM

## 2017-04-04 ENCOUNTER — Other Ambulatory Visit: Payer: Self-pay | Admitting: Neurology

## 2017-04-04 ENCOUNTER — Telehealth: Payer: Self-pay | Admitting: Neurology

## 2017-04-04 NOTE — Telephone Encounter (Signed)
Eugene Harding, would you let patient know Dr. Erlinda Hong and Dr. Jaynee Eagles recommend a sleep evaluation for OSA which can be a cause of strokes. If he is ok with that please place order for sleep eval [facility] to Dr. Brett Fairy. And inform him he will be seen by our sleep doctor in consult before any sleep test. (FYI Dr Erlinda Hong reviewed all previous and current MRIs with patient while he was in the hospital this weekend, I called patient and left him a voicemail  Stating that since Dr Erlinda Hong reviewed everything I will not be calling or seeing him until his follow up unless something new occurs or he has further questions thanks)  Dr. Brett Fairy: Patient was admitted for another stroke this past weekend. Placing consult for sleep eval. I didn't know if there were guidelines on how quickly a sleep test is performed for both stroke management and insurance purposes. Thank you

## 2017-04-05 NOTE — Telephone Encounter (Signed)
Called and LVM asking for a call back to discuss a referral for sleep study which is recommended by Dr. Jaynee Eagles & Dr. Erlinda Hong.

## 2017-04-05 NOTE — Telephone Encounter (Signed)
Looks like the referral is already in and [facility] will take care of getting him set up. It was ordered on 11/3 by Dr Erlinda Hong.

## 2017-04-05 NOTE — Telephone Encounter (Signed)
He would need to see me first, and I can order PSG/ SPLIT or whatever is necessary, he has a stroke history ? CD

## 2017-04-05 NOTE — Telephone Encounter (Signed)
Myriam Jacobson, would you set this patient up with an appt with dr dohmeier? We need to test him for OSA which is a risk factor for stroke, Dr. Erlinda Hong and I recommend. If he accepts, let me know and I can place the porder  thanks

## 2017-04-06 NOTE — Telephone Encounter (Signed)
Called and LVM asking for call back

## 2017-04-08 NOTE — Telephone Encounter (Addendum)
Notes forwarded to pt's PCP Dr. Manuella Ghazi per Dr. Jaynee Eagles. Included: Dr. Erlinda Hong H&P & progress note, Dr. Jaynee Eagles office note. MRIs & CT also forwarded to Dr. Manuella Ghazi.

## 2017-04-08 NOTE — Telephone Encounter (Signed)
Patient returned call and he verbalized understanding that sleep study referral has been made to r/o OSA which can be a cause of strokes. He states he has an appt with his PCP tomorrow. He said he had been told his results would be sent to PCP. I told him I would look into this. He will f/u w/ Dr. Jaynee Eagles in January. He had no further questions.

## 2017-04-08 NOTE — Telephone Encounter (Signed)
bethany would you forward all notes to his pcp please? thanks

## 2017-04-08 NOTE — Telephone Encounter (Signed)
I called the patient again both on cell phone & home phone #. Cell phone vm picked up immediately. I left a message on the home phone number asking the patient for a call back regarding the recommendations that Dr. Erlinda Hong & Dr. Jaynee Eagles made following his admission to the hospital last weekend. I left our office number for the call back.    --The referral for sleep study has already been made and he should be hearing from them as well. I also let Dr. Jaynee Eagles know that he still has not returned my call.

## 2017-04-08 NOTE — Telephone Encounter (Signed)
Thank you, Bethany.  Rosalin Hawking, MD PhD Stroke Neurology 04/08/2017 12:21 PM

## 2017-04-16 ENCOUNTER — Telehealth: Payer: Self-pay | Admitting: Neurology

## 2017-04-16 DIAGNOSIS — R269 Unspecified abnormalities of gait and mobility: Secondary | ICD-10-CM

## 2017-04-16 NOTE — Telephone Encounter (Signed)
I called the patient.  The patient has had issues with cerebrovascular disease, he has had what he calls dizziness since August 2018.  The patient describes the sensation that occurs only with standing, it goes away when he sits down or lies down.  If he reaches over and touches a table or chair, the sensation markedly improves.  The patient likely is describing a sensation of imbalance, not true dizziness.  I do not think that medications are likely to help this.  I have indicated that the use of a cane may offer more benefit, I also offered to get him into physical therapy to work on balance training, this may also help.  He is amenable to getting physical therapy.  I will try to get physical therapy set up at [facility].

## 2017-04-16 NOTE — Addendum Note (Signed)
Addended by: Kathrynn Ducking on: 04/16/2017 01:18 PM   Modules accepted: Orders

## 2017-04-16 NOTE — Telephone Encounter (Signed)
Called patient back. Educated patient that atorvastatin is for cholesterol, not pain or dizziness.  He is requesting something to help with the dizziness he is having. It is unchanged since August.  I asked about sleep referral. He states he is holding off on that until he can get the dizziness better under control.   Advised AA,MD out of office today. Will send to CW,MD [facility]) to see if there is anything we can offer to help with dizziness. He verbalized understanding.

## 2017-04-16 NOTE — Telephone Encounter (Signed)
Pt calling re: atorvastatin (LIPITOR) 80 MG tablet he states its been given for pain but  it is doing him no good and is asking that something else be called in for him  [id number] - [place], [place] - [address] [phone] (Phone) [phone] (Fax)

## 2017-04-26 ENCOUNTER — Telehealth: Payer: Self-pay | Admitting: Neurology

## 2017-04-26 NOTE — Telephone Encounter (Signed)
Eugene Harding, If he wants to discuss he is more than happy to schedule an appointment. Please ask him what he wants to discuss, maybe it is a quick question you can answer instead. I will look for the disc and if we find it will mail thanks

## 2017-04-26 NOTE — Telephone Encounter (Signed)
I would like Dr. Phoebe Sharps opinion, I don;t think he is disabled fromhis strokes and we don;t need to see him if his other doctor is going forward with the paperwork.

## 2017-04-26 NOTE — Telephone Encounter (Signed)
I can see him next available (likely in Jan) to evaluate his condition to see whether he needs disability. Thanks.   Rosalin Hawking, MD PhD Stroke Neurology 04/26/2017 5:24 PM

## 2017-04-26 NOTE — Telephone Encounter (Signed)
Patient calling to talk about stuff from his regular doctor. He would not go into detail.

## 2017-04-26 NOTE — Telephone Encounter (Signed)
Please schedule him Katrina with Dr. Erlinda Hong in January, per Dr. Erlinda Hong email below thanks

## 2017-04-26 NOTE — Telephone Encounter (Signed)
Called patient. He stated that he went to see his primary care MD. His doctor is putting him on disability. He wanted to speak about this in more detail w/ Dr. Jaynee Eagles. He is also requesting that he receive his MRI disc back that he had given Dr. Jaynee Eagles for review. I informed him if we could not mail it, he could pick it up at the office. I also told him I would ask Dr. Jaynee Eagles about it and see if she would be able to call him. He verbalized understanding and appreciation.

## 2017-04-26 NOTE — Telephone Encounter (Signed)
Called patient. He verbalized understanding that per Dr. Jaynee Eagles, he should be seen by Dr. Erlinda Hong as follow-up to his recent stroke & hospitalization before any comments can be made regarding disability forms.

## 2017-04-27 NOTE — Telephone Encounter (Signed)
Rn call patient that his appt with be with Dr. Erlinda Hong to discuss his not be able to work. PT stated his PCP Dr.Shah put him out of work.Pt will be seeing his disability worker on 05/01/2017 for an appt. The paperwork for disability was already filled out and turn in from his PCP. Rn stated per Dr. Erlinda Hong he needs to evaluate patient for his condition if he is disable from the stroke. Appt made with Dr. Erlinda Hong. Appt cancel with Hoyle Sauer NP.

## 2017-05-13 ENCOUNTER — Other Ambulatory Visit: Payer: Self-pay | Admitting: Neurology

## 2017-05-26 ENCOUNTER — Telehealth: Payer: Self-pay | Admitting: Neurology

## 2017-05-26 NOTE — Telephone Encounter (Signed)
Pt said he gave Jaynee Eagles his MRI to look over and hasnt gotten it back yet. Pt is wanting to know when he will be able to get it mailed to him

## 2017-05-27 ENCOUNTER — Ambulatory Visit (INDEPENDENT_AMBULATORY_CARE_PROVIDER_SITE_OTHER): Payer: BLUE CROSS/BLUE SHIELD | Admitting: Otolaryngology

## 2017-05-27 DIAGNOSIS — H903 Sensorineural hearing loss, bilateral: Secondary | ICD-10-CM

## 2017-05-27 DIAGNOSIS — R42 Dizziness and giddiness: Secondary | ICD-10-CM | POA: Diagnosis not present

## 2017-05-27 NOTE — Telephone Encounter (Signed)
Hinton Dyer, can you help me? The CD has been misplaced, I can;t find it,After I reviewed the images I brought it to neuroradiology to look at it there as well with them and I think I may have left it in a computer. Please apologize to patient. Would you be able to get a new CD sent to him? Talk to patient, it may be [place] but we can make a request and I can fill out a medical release form for patient so he doesn't have to come back to the office. thanks

## 2017-05-28 NOTE — Telephone Encounter (Signed)
Patient Had MRI at [facility] . Hilda Blades Is calling and Getting  Report and CD and we will Mail to patient .  I spoke to patient he is fine with this.

## 2017-06-02 ENCOUNTER — Other Ambulatory Visit: Payer: Self-pay | Admitting: Neurology

## 2017-06-03 NOTE — Telephone Encounter (Signed)
Eugene Harding has Mailed MRI CD and Report to Patient.

## 2017-06-07 ENCOUNTER — Other Ambulatory Visit: Payer: Self-pay | Admitting: Neurology

## 2017-06-09 NOTE — Telephone Encounter (Signed)
Looks like he is on plavix and asa 81. Will decline

## 2017-06-14 DIAGNOSIS — Z0271 Encounter for disability determination: Secondary | ICD-10-CM

## 2017-06-23 ENCOUNTER — Ambulatory Visit (INDEPENDENT_AMBULATORY_CARE_PROVIDER_SITE_OTHER): Payer: BLUE CROSS/BLUE SHIELD | Admitting: Neurology

## 2017-06-23 ENCOUNTER — Encounter: Payer: Self-pay | Admitting: Neurology

## 2017-06-23 VITALS — BP 132/76 | HR 100 | Wt 190.0 lb

## 2017-06-23 DIAGNOSIS — E1159 Type 2 diabetes mellitus with other circulatory complications: Secondary | ICD-10-CM

## 2017-06-23 DIAGNOSIS — E11319 Type 2 diabetes mellitus with unspecified diabetic retinopathy without macular edema: Secondary | ICD-10-CM

## 2017-06-23 DIAGNOSIS — E785 Hyperlipidemia, unspecified: Secondary | ICD-10-CM | POA: Diagnosis not present

## 2017-06-23 DIAGNOSIS — Z87891 Personal history of nicotine dependence: Secondary | ICD-10-CM

## 2017-06-23 DIAGNOSIS — R200 Anesthesia of skin: Secondary | ICD-10-CM | POA: Diagnosis not present

## 2017-06-23 DIAGNOSIS — R42 Dizziness and giddiness: Secondary | ICD-10-CM | POA: Diagnosis not present

## 2017-06-23 DIAGNOSIS — R202 Paresthesia of skin: Secondary | ICD-10-CM

## 2017-06-23 DIAGNOSIS — I10 Essential (primary) hypertension: Secondary | ICD-10-CM | POA: Diagnosis not present

## 2017-06-23 MED ORDER — ATORVASTATIN CALCIUM 80 MG PO TABS: 40.0000 mg | ORAL_TABLET | Freq: Every day | ORAL | 3 refills | Status: AC

## 2017-06-23 NOTE — Patient Instructions (Addendum)
-   stop baby ASA in 10 days - continue plavix and lipitor for stroke prevention - check BP and glucose at home and record - Follow up with your primary care physician for stroke risk factor modification. Recommend maintain blood pressure goal <130/80, diabetes with hemoglobin A1c goal below 7.0% and lipids with LDL cholesterol goal below 70 mg/dL.  - send message to Dr. Manuella Ghazi regarding your symptoms and work related issue - continue follow up with eye doctor for blurry vision - recommend psychology referral for chronic subjective dizziness - will do nerve conduction study for left hand numbness - diabetic diet and regular exercise - follow up in 4 months

## 2017-06-24 DIAGNOSIS — R202 Paresthesia of skin: Principal | ICD-10-CM

## 2017-06-24 DIAGNOSIS — E11319 Type 2 diabetes mellitus with unspecified diabetic retinopathy without macular edema: Secondary | ICD-10-CM | POA: Insufficient documentation

## 2017-06-24 DIAGNOSIS — R200 Anesthesia of skin: Secondary | ICD-10-CM | POA: Insufficient documentation

## 2017-06-24 DIAGNOSIS — E785 Hyperlipidemia, unspecified: Secondary | ICD-10-CM | POA: Insufficient documentation

## 2017-06-24 DIAGNOSIS — E119 Type 2 diabetes mellitus without complications: Secondary | ICD-10-CM | POA: Insufficient documentation

## 2017-06-24 DIAGNOSIS — Z87891 Personal history of nicotine dependence: Secondary | ICD-10-CM | POA: Insufficient documentation

## 2017-06-24 DIAGNOSIS — R42 Dizziness and giddiness: Secondary | ICD-10-CM | POA: Insufficient documentation

## 2017-06-24 NOTE — Progress Notes (Addendum)
STROKE NEUROLOGY FOLLOW UP NOTE  NAME: Eugene Harding DOB: May 17, 1961  REASON FOR VISIT: stroke follow up HISTORY FROM: pt and chart  Today we had the pleasure of seeing Eugene Harding in follow-up at our Neurology Clinic. Pt was accompanied by no one.   Initial consult 03/25/17 (AA) [name] is a 57 y.o. male here as a referral from Dr. Manuella Ghazi for dizziness. This patient has not taken care of himself in years, is obese, no exercise, hasn't seen a doctor in years, smoker so did not discover he had HTN and diabetes until he had acute onset stroke.  The patient has uncontrolled hypertension, uncontrolled diabetes and a past medical history of vertigo. Here for dizziness, has been suddenly has been occurring in a persistent pattern for weeks. The courses been increasing, he also is a current smoker, obesity. He is on meclizine for dizziness. He had vertigo about 7 years ago. Started in August with a new diagnosis of diabetes, unsure how long he has been diabetic never went to a doctor. He started getting numb in his hand on the left. It was acute, end of August. Then he started getting dizzy, blurry in his eyes, he has dizziness, no room spinning, feels like he is going to pass out. Some days it has been worse. Not better. He has not been to vestibular therapy. He has seen ENT. He has also seen. He went to urgent care and his BP was over 200. He went to [facility], they did not admit him. That is when he was diagnosed with HTN and diabetes. He went to cardiology as well. No other focal neurologic deficits, associated symptoms, inciting events or modifiable factors.  Reviewed notes, labs and imaging from outside physicians, which showed:  Patient has uncontrolled hypertension and diabetes. Onset has been sudden and has been occurring in a persistent pattern for weeks. The courses been increasing. The dizziness is characterized as lightheadedness and off balance. Symptoms have been associated with  diabetes, while the symptoms have not been associated with elevated blood pressure headache. He is taking meclizine. Has been to ENT. Has already had an MRI.   Echocardiogram of the heart March 15 2017:  - Mild LVH with LVEF 55-60% and grade 1 diastolic dysfunction. Mildly calcified mitral annulus with trivial mitral regurgitation. Mildly calcified aortic valve. Trivial tricuspid regurgitation.  MRI of the brain 03/12/2017 showed a left cerebellar infarct which is likely chronic.  CMP with increased glucose 280, hemoglobin A1c 8.8, BUN 17 and creatinine 0.86 collected 01/31/2017.  Admission 04/02/17 - for worsening dizziness, blurry vision and left-hand numbness.  MRI showed small subacute ischemic infarct involving the left lateral thalamus/PLIC as well as old left cerebellum and left CR lacunar infarcts.  MRA head and neck unremarkable.  EF 55-60%.  LDL 84 and A1c 8.1.  However, looking back his MRI on 02/12/17 showed left CR and left mesial temporal lobe acute/subacute infarcts,  Which was not reported at that time.  He was on aspirin PTA, put on DAPT for 3 months as well as Lipitor 40.  Left hand numbness not consistent with stroke, recommend outpatient EMG/NCS.  Also recommend outpatient sleep study and quit smoking.  Interval History During the interval time, the patient has been doing stable.  Still has same complaints over time.  Still complains that every day dizziness some days better, some days worse, no clear triggers.  Cannot give details of dizziness, but feels body on slow motion. no difference over  the day, or difference over the last  6 months. denies vertigo, nausea or vomiting.    Still complains of blurry vision, feels food running together while eating.  Has seen ophthalmologist, diagnosed with diabetic retinopathy.  Change glasses, no difference.  Stated blurry vision essentially the same since 12/2016.  Continue complaint of left hand numbness, now more in the  fingers, palm and back of the hand much improved.  Most prominent in the first 2 fingers, the last 3 fingers getting better.  Denies pain.  He has not been back to work.  His employer does not want him back to work due to liability issue.  He has been follow-up with PCP Dr. Manuella Ghazi for disability claim.  REVIEW OF SYSTEMS: Full 14 system review of systems performed and notable only for those listed below and in HPI above, all others are negative:  Constitutional:   Cardiovascular:  Ear/Nose/Throat:   Skin:  Eyes: Blurry vision Respiratory:   Gastroitestinal:   Genitourinary:  Hematology/Lymphatic:   Endocrine:  Musculoskeletal: Walking difficulty Allergy/Immunology:   Neurological: Dizziness, numbness Psychiatric: Depression, nervous, anxious Sleep:   The following represents the patient's updated allergies and side effects list: No Known Allergies  The neurologically relevant items on the patient's problem list were reviewed on today's visit.  Neurologic Examination  A problem focused neurological exam (12 or more points of the single system neurologic examination, vital signs counts as 1 point, cranial nerves count for 8 points) was performed.  Blood pressure 132/76, pulse 100, weight 190 lb (86.2 kg).  General - Well nourished, well developed, in no apparent distress.  Ophthalmologic - Fundi not visualized due to eye movement.  Cardiovascular - Regular rate and rhythm.  Mental Status -  Level of arousal and orientation to time, place, and person were intact. Language including expression, naming, repetition, comprehension was assessed and found intact. Fund of Knowledge was assessed and was intact.  Cranial Nerves II - XII - II - Visual field intact OU. III, IV, VI - Extraocular movements intact. V - Facial sensation intact bilaterally. VII - Facial movement intact bilaterally. VIII - Hearing & vestibular intact bilaterally. X - Palate elevates symmetrically. XI -  Chin turning & shoulder shrug intact bilaterally. XII - Tongue protrusion intact.  Motor Strength - The patient's strength was normal in all extremities and pronator drift was absent.  Bulk was normal and fasciculations were absent.   Motor Tone - Muscle tone was assessed at the neck and appendages and was normal.  Reflexes - The patient's reflexes were 1+ in all extremities and he had no pathological reflexes.  Sensory - Light touch, temperature/pinprick were assessed and were symmetrical except left hand first 2 fingers decreased pinprick sensation.    Coordination - The patient had normal movements in the hands with no ataxia or dysmetria.  Tremor was absent.  Gait and Station - The patient's transfers, posture, gait, station, and turns were observed as normal.  No difficulty of toe, heel, and tandem walking. However, bring a cane with him.    Functional score  mRS = 1   0 - No symptoms.   1 - No significant disability. Able to carry out all usual activities, despite some symptoms.   2 - Slight disability. Able to look after own affairs without assistance, but unable to carry out all previous activities.   3 - Moderate disability. Requires some help, but able to walk unassisted.   4 - Moderately severe disability. Unable to attend to  own bodily needs without assistance, and unable to walk unassisted.   5 - Severe disability. Requires constant nursing care and attention, bedridden, incontinent.   6 - Dead.   NIH Stroke Scale = 0   Data reviewed: I personally reviewed the images and agree with the radiology interpretations.  MRI brain 02/12/17 Mild chronic ischemic change.  No acute abnormality.  However, on my review reading of the image, it seems there is a subacute infarct and left CR, and acute infarct at left medial temporal lobe.  MRI of the brain 03/12/2017 (no imaging available) showed a left cerebellar infarct which is likely chronic.  Ct Head Wo  Contrast 04/02/2017 IMPRESSION:  No acute intracranial abnormality. Extensive atherosclerosis. Mild sinus disease.   Mr [facility] and Neck Wo Contrast  04/02/2017 MRI HEAD  1. 15 mm subacute ischemic infarct involving the lateral left thalamus/posterior limb of the left internal capsule. No associated hemorrhage or mass effect.  2. Otherwise stable atrophy with chronic ischemic changes.   MRA HEAD  1. Negative intracranial MRA for large vessel occlusion. No high-grade or correctable stenosis.  2. Distal small vessel atheromatous irregularity involving both the anterior and posterior circulations.   MRA NECK  1. Mild atheromatous stenosis of approximately 35% at the origin of the right ICA. Right carotid artery system otherwise widely patent.  2. Widely patent left carotid artery system without hemodynamically significant stenosis.  3. Widely patent vertebral arteries within the neck. Right vertebral artery dominant.   Transthoracic Echocardiogram  03/15/2017 Study Conclusions - Left ventricle: The cavity size was normal. Wall thickness was increased in a pattern of mild LVH. Systolic function was normal. The estimated ejection fraction was in the range of 55% to 60%. Wall motion was normal; there were no regional wall motion abnormalities. Doppler parameters are consistent with abnormal left ventricular relaxation (grade 1 diastolic dysfunction). - Aortic valve: Trileaflet; mildly calcified leaflets. - Mitral valve: Mildly calcified annulus. There was trivial regurgitation. - Right atrium: Central venous pressure (est): 3 mm Hg. - Atrial septum: No defect or patent foramen ovale was identified. - Tricuspid valve: There was trivial regurgitation. - Pulmonary arteries: Systolic pressure could not be accurately estimated. - Pericardium, extracardiac: There was no pericardial effusion. Impressions: - Mild LVH with LVEF 55-60% and grade 1 diastolic  dysfunction. Mildly calcified mitral annulus with trivial mitral regurgitation. Mildly calcified aortic valve. Trivial tricuspid regurgitation.  collected 01/31/2017. CMP with increased glucose 280, hemoglobin A1c 8.8, BUN 17 and creatinine 0.86   Component     Latest Ref Rng & Units 04/02/2017  Cholesterol     0 - 200 mg/dL 168  Triglycerides     <150 mg/dL 260 (H)  HDL Cholesterol     >40 mg/dL 32 (L)  Total CHOL/HDL Ratio     RATIO 5.3  VLDL     0 - 40 mg/dL 52 (H)  LDL (calc)     0 - 99 mg/dL 84  Hemoglobin A1C     4.8 - 5.6 % 8.1 (H)  Mean Plasma Glucose     mg/dL 185.77    Assessment: As you may recall, he is a 57 y.o. Caucasian male with PMH of HTN, HLD, obesity, smoker followed up in clinic for dizziness, blurry vision and left hand numbness. Symptoms started in 12/2016. MRI 01/12/17 showed left CR and left mesial temporal lobe acute/subacute infarcts, chronic left cerebellum infarct.  Symptoms worsened in 04/2017.  MRI showed subacute left thalamic/PLIC infarct.  MRA head and  neck unremarkable.  EF 55-60%.  LDL 84 and A1c 8.1.  Overall, patient stroke still consistent with small vessel disease given significance and uncontrolled stroke risk factors including hypertension, hyperlipidemia, diabetes, smoking and obesity as well as possible OSA. Put on DAPT and Lipitor 40.  However, patient continued to complain of dizziness feeding feels like light motion all the time, blurry vision following with ophthalmologist diagnosed with diabetic retinopathy, and left hand numbness improved from prior.  His current stated symptoms not consistent with stroke, more likely due to chronic subjective dizziness, diabetic retinopathy, and left hand neuropathy.  Will do EMG/NCS.  Near the end of DA PT for 3 months, transition to Plavix alone.  Plan:  - stop baby ASA in 10 days - continue plavix and lipitor for stroke prevention - check BP and glucose at home and record - Follow up with your  primary care physician for stroke risk factor modification. Recommend maintain blood pressure goal <130/80, diabetes with hemoglobin A1c goal below 7.0% and lipids with LDL cholesterol goal below 70 mg/dL.  - send note to Dr. Manuella Ghazi regarding your symptoms and work related issue - continue follow up with ophthalmology for blurry vision - recommend psychology referral for chronic subjective dizziness - will do EMG/NCS for left hand numbness - Consider sleep study next visit to rule out OSA - diabetic diet and regular exercise - continue to abstain from smoking - follow up in 4 months. If stable, can be discharged from clinic.  I spent more than 25 minutes of face to face time with the patient. Greater than 50% of time was spent in counseling and coordination of care. We discussed about etiology of current symptoms, EMG/NCS, and follow up with Dr. Manuella Ghazi for disability issue.   Orders Placed This Encounter  Procedures  . Ambulatory referral to Psychology    Referral Priority:   Routine    Referral Type:   Psychiatric    Referral Reason:   Specialty Services Required    Requested Specialty:   Psychology    Number of Visits Requested:   1  . NCV with EMG(electromyography)    Standing Status:   Future    Standing Expiration Date:   06/23/2018    Order Specific Question:   Where should this test be performed?    Answer:   [facility]    Meds ordered this encounter  Medications  . atorvastatin (LIPITOR) 80 MG tablet    Sig: Take 0.5 tablets (40 mg total) by mouth daily at 6 PM.    Dispense:  45 tablet    Refill:  3    Patient Instructions  - stop baby ASA in 10 days - continue plavix and lipitor for stroke prevention - check BP and glucose at home and record - Follow up with your primary care physician for stroke risk factor modification. Recommend maintain blood pressure goal <130/80, diabetes with hemoglobin A1c goal below 7.0% and lipids with LDL cholesterol goal below 70 mg/dL.  - send  message to Dr. Manuella Ghazi regarding your symptoms and work related issue - continue follow up with eye doctor for blurry vision - recommend psychology referral for chronic subjective dizziness - will do nerve conduction study for left hand numbness - diabetic diet and regular exercise - follow up in 4 months   Rosalin Hawking, MD PhD [facility] [address] [place], [place] [address] [phone]

## 2017-06-24 NOTE — Addendum Note (Signed)
Addended by: Rosalin Hawking on: 06/24/2017 03:50 PM   Modules accepted: Orders

## 2017-06-28 ENCOUNTER — Ambulatory Visit: Payer: BLUE CROSS/BLUE SHIELD | Admitting: Nurse Practitioner

## 2017-06-30 ENCOUNTER — Telehealth: Payer: Self-pay | Admitting: Neurology

## 2017-06-30 NOTE — Telephone Encounter (Signed)
Noted. Thanks much.  Rosalin Hawking, MD PhD Stroke Neurology 06/30/2017 3:28 PM

## 2017-06-30 NOTE — Telephone Encounter (Signed)
Patient has denied referral.    FYI:  We called your patient and he has declined to schedule an appointment with [place], at this time.  We appreciate the referral.

## 2017-07-21 ENCOUNTER — Ambulatory Visit (INDEPENDENT_AMBULATORY_CARE_PROVIDER_SITE_OTHER): Payer: BLUE CROSS/BLUE SHIELD | Admitting: Neurology

## 2017-07-21 ENCOUNTER — Encounter (INDEPENDENT_AMBULATORY_CARE_PROVIDER_SITE_OTHER): Payer: BLUE CROSS/BLUE SHIELD | Admitting: Neurology

## 2017-07-21 DIAGNOSIS — R200 Anesthesia of skin: Secondary | ICD-10-CM

## 2017-07-21 DIAGNOSIS — Z0289 Encounter for other administrative examinations: Secondary | ICD-10-CM

## 2017-07-21 DIAGNOSIS — R202 Paresthesia of skin: Secondary | ICD-10-CM | POA: Diagnosis not present

## 2017-07-22 ENCOUNTER — Telehealth: Payer: Self-pay

## 2017-07-22 NOTE — Procedures (Signed)
Full Name: Asaf Elmquist Gender: Male MRN #: [id number] Date of Birth: 2061/02/20    Visit Date: 07/21/17 11:29 Age: 57 Years 74 Months Old Examining Physician: Marcial Pacas, MD  Referring Physician: Krista Blue, MD History: 57 year old male complains of left arm persistent numbness and weakness since his stroke  Summary of the tests: Nerve conduction study: Left median, ulnar sensory and motor responses were normal.  Electromyography: Selective needle examination of left upper extremity and left cervical paraspinal muscles were normal.   Conclusion: This is a normal study.  There is no electrodiagnostic evidence of left upper extremity neuropathy or left cervical radiculopathy.    ------------------------------- Marcial Pacas, M.D.  [facility] [place], [place] [address] Tel: [phone] Fax: [phone]        [facility]    Nerve / Sites Muscle Latency Ref. Amplitude Ref. Rel Amp Segments Distance Velocity Ref. Area    ms ms mV mV %  cm m/s m/s mVms  L Median - APB     Wrist APB 4.1 ?4.4 7.2 ?4.0 100 Wrist - APB 7   25.8     Upper arm APB 8.3  7.5  103 Upper arm - Wrist 21 50 ?49 27.5  L Ulnar - ADM     Wrist ADM 3.0 ?3.3 10.5 ?6.0 100 Wrist - ADM 7   27.1     B.Elbow ADM 6.8  11.0  104 B.Elbow - Wrist 20 53 ?49 28.3     A.Elbow ADM 8.7  10.1  92.1 A.Elbow - B.Elbow 10 52 ?49 26.1         A.Elbow - Wrist             SNC    Nerve / Sites Rec. Site Peak Lat Ref.  Amp Ref. Segments Distance Peak Diff Ref.    ms ms V V  cm ms ms  L Median, Ulnar - Transcarpal comparison     Median Palm Wrist 2.1 ?2.2 71 ?35 Median Palm - Wrist 8       Ulnar Palm Wrist 2.0 ?2.2 9 ?12 Ulnar Palm - Wrist 8          Median Palm - Ulnar Palm  0.2 ?0.4  L Median - Orthodromic (Dig II, Mid palm)     Dig II Wrist 3.1 ?3.4 17 ?10 Dig II - Wrist 13    L Ulnar - Orthodromic, (Dig V, Mid palm)     Dig V Wrist 2.7 ?3.1 6 ?5 Dig V - Wrist 5             F  Wave    Nerve  F Lat Ref.   ms ms  L Ulnar - ADM 29.5 ?32.0       EMG full       EMG Summary Table    Spontaneous MUAP Recruitment  Muscle IA Fib PSW Fasc Other Amp Dur. Poly Pattern  L. First dorsal interosseous Normal None None None _______ Normal Normal Normal Normal  L. Pronator teres Normal None None None _______ Normal Normal Normal Normal  L. Extensor digitorum communis Normal None None None _______ Normal Normal Normal Normal  L. Biceps brachii Normal None None None _______ Normal Normal Normal Normal  L. Deltoid Normal None None None _______ Normal Normal Normal Normal  L. Triceps brachii Normal None None None _______ Normal Normal Normal Normal  L. Cervical paraspinals Normal None None None _______ Normal Normal Normal Normal

## 2017-07-22 NOTE — Telephone Encounter (Signed)
-----   Message from Rosalin Hawking, MD sent at 07/22/2017  9:59 AM EST ----- Could you please let the patient know that the nerve conduction test done recently in our office was normal. Please continue current treatment. Thanks.  Rosalin Hawking, MD PhD Stroke Neurology 07/22/2017 9:59 AM

## 2017-07-22 NOTE — Telephone Encounter (Signed)
Notes recorded by Marval Regal, RN on 07/22/2017 at 3:25 PM EST LEft vm on home number preferred number to call back about EMG results. ------

## 2017-07-26 NOTE — Telephone Encounter (Signed)
Rn call patient that the nerve conduction test was normal. Pt verbalized understanding. ------

## 2017-10-21 ENCOUNTER — Ambulatory Visit: Payer: BLUE CROSS/BLUE SHIELD | Admitting: Nurse Practitioner

## 2019-08-01 ENCOUNTER — Telehealth: Payer: Self-pay | Admitting: Urology

## 2019-08-01 NOTE — Telephone Encounter (Signed)
Patient called regarding appt    Wants to be seen sooner due to recent ER visit   Per Estill Bamberg we will ask [place] and schedule accordingly.  Patient stated "ruptured kidney"

## 2019-08-02 ENCOUNTER — Other Ambulatory Visit: Payer: Self-pay

## 2019-08-02 NOTE — Telephone Encounter (Signed)
Olivia Mackie spoke with Patient - Going to book appointment for April 2021

## 2019-09-05 ENCOUNTER — Ambulatory Visit: Payer: BLUE CROSS/BLUE SHIELD | Admitting: Urology

## 2019-10-24 ENCOUNTER — Other Ambulatory Visit (HOSPITAL_COMMUNITY): Payer: Self-pay | Admitting: Urology

## 2019-10-24 ENCOUNTER — Other Ambulatory Visit: Payer: Self-pay | Admitting: Urology

## 2019-10-24 DIAGNOSIS — R972 Elevated prostate specific antigen [PSA]: Secondary | ICD-10-CM

## 2019-10-31 ENCOUNTER — Encounter (HOSPITAL_COMMUNITY)
Admission: RE | Admit: 2019-10-31 | Discharge: 2019-10-31 | Disposition: A | Discharge status: Home / Self Care | Payer: Medicare Other | Source: Ambulatory Visit | Attending: Urology | Admitting: Urology

## 2019-10-31 ENCOUNTER — Encounter (HOSPITAL_COMMUNITY): Payer: Self-pay

## 2019-10-31 ENCOUNTER — Encounter (INDEPENDENT_AMBULATORY_CARE_PROVIDER_SITE_OTHER): Payer: Self-pay

## 2019-10-31 ENCOUNTER — Other Ambulatory Visit (HOSPITAL_COMMUNITY)
Admission: RE | Admit: 2019-10-31 | Discharge: 2019-10-31 | Disposition: A | Discharge status: Home / Self Care | Payer: Medicare Other | Source: Ambulatory Visit | Attending: Urology | Admitting: Urology

## 2019-10-31 ENCOUNTER — Other Ambulatory Visit: Payer: Self-pay

## 2019-10-31 DIAGNOSIS — I444 Left anterior fascicular block: Secondary | ICD-10-CM | POA: Diagnosis not present

## 2019-10-31 DIAGNOSIS — E119 Type 2 diabetes mellitus without complications: Secondary | ICD-10-CM | POA: Insufficient documentation

## 2019-10-31 DIAGNOSIS — Z20822 Contact with and (suspected) exposure to covid-19: Secondary | ICD-10-CM | POA: Diagnosis not present

## 2019-10-31 DIAGNOSIS — Z01818 Encounter for other preprocedural examination: Secondary | ICD-10-CM | POA: Diagnosis not present

## 2019-10-31 LAB — HEMOGLOBIN A1C
Hgb A1c MFr Bld: 7.9 % — ABNORMAL HIGH (ref 4.8–5.6)
Mean Plasma Glucose: 180.03 mg/dL

## 2019-10-31 LAB — BASIC METABOLIC PANEL
Anion gap: 11 (ref 5–15)
BUN: 21 mg/dL — ABNORMAL HIGH (ref 6–20)
CO2: 24 mmol/L (ref 22–32)
Calcium: 9.6 mg/dL (ref 8.9–10.3)
Chloride: 107 mmol/L (ref 98–111)
Creatinine, Ser: 0.91 mg/dL (ref 0.61–1.24)
GFR calc Af Amer: >60 mL/min (ref >60)
GFR calc non Af Amer: >60 mL/min (ref >60)
Glucose, Bld: 95 mg/dL (ref 70–99)
Potassium: 4.5 mmol/L (ref 3.5–5.1)
Sodium: 142 mmol/L (ref 135–145)

## 2019-10-31 LAB — CBC
HCT: 50.8 % (ref 39.0–52.0)
Hemoglobin: 15.8 g/dL (ref 13.0–17.0)
MCH: 29.3 pg (ref 26.0–34.0)
MCHC: 31.1 g/dL (ref 30.0–36.0)
MCV: 94.1 fL (ref 80.0–100.0)
Platelets: 152 10*3/uL (ref 150–400)
RBC: 5.4 MIL/uL (ref 4.22–5.81)
RDW: 14 % (ref 11.5–15.5)
WBC: 11.1 10*3/uL — ABNORMAL HIGH (ref 4.0–10.5)
nRBC: 0 % (ref 0.0–0.2)

## 2019-10-31 NOTE — Progress Notes (Signed)
COVID Vaccine Completed:no Date COVID Vaccine completed: COVID vaccine manufacturer: Pfizer    [employer]   PCP - Dr. Anthonette Legato Cardiologist - no  Chest x-ray - no EKG - 10/31/19 Stress Test - no ECHO - 2018 Cardiac Cath - no  Sleep Study - no CPAP -   Fasting Blood Sugar - NA Checks Blood Sugar _____ times a day  Blood Thinner Instructions:[facility]/ Dr. Manuella Ghazi Aspirin InstructionsPt reports that :Dr. Lyndal Rainbow PA said don't take it the AM of surgery. Last Dose:  Anesthesia review:   Patient denies shortness of breath, fever, cough and chest pain at PAT appointment yes  Patient verbalized understanding of instructions that were given to them at the PAT appointment. Patient was also instructed that they will need to review over the PAT instructions again at home before surgery.  yes

## 2019-10-31 NOTE — Patient Instructions (Signed)
DUE TO COVID-19 ONLY ONE VISITOR IS ALLOWED TO COME WITH YOU AND STAY IN THE WAITING ROOM ONLY DURING PRE OP AND PROCEDURE DAY OF SURGERY. THE 1 VISITOR MAY VISIT WITH YOU AFTER SURGERY IN YOUR PRIVATE ROOM DURING VISITING HOURS ONLY!  YOU NEED TO HAVE A COVID 19 TEST ON__6/1/21_____ @_2 :35______, THIS TEST MUST BE DONE BEFORE SURGERY, COME  [place] , [id number].  ([place]) ONCE YOUR COVID TEST IS COMPLETED, PLEASE BEGIN THE QUARANTINE INSTRUCTIONS AS OUTLINED IN YOUR HANDOUT.                Trudi Ida    Your procedure is scheduled on: 11/03/19   Report to [facility] Main  Entrance   Report to admitting at  9:30 AM     Call this number if you have problems the morning of surgery [phone]    Remember: Do not eat food or drink liquids :After Midnight  . BRUSH YOUR TEETH MORNING OF SURGERY AND RINSE YOUR MOUTH OUT, NO CHEWING GUM CANDY OR MINTS.     Take these medicines the morning of surgery with A SIP OF WATER: Amlodipine  DO NOT TAKE ANY DIABETIC MEDICATIONS DAY OF YOUR SURGERY   How to Manage Your Diabetes Before and After Surgery  Why is it important to control my blood sugar before and after surgery? . Improving blood sugar levels before and after surgery helps healing and can limit problems. . A way of improving blood sugar control is eating a healthy diet by: o  Eating less sugar and carbohydrates o  Increasing activity/exercise o  Talking with your doctor about reaching your blood sugar goals . High blood sugars (greater than 180 mg/dL) can raise your risk of infections and slow your recovery, so you will need to focus on controlling your diabetes during the weeks before surgery. . Make sure that the doctor who takes care of your diabetes knows about your planned surgery including the date and location.  How do I manage my blood sugar before surgery? . Check your blood sugar at least 4 times a day, starting 2 days  before surgery, to make sure that the level is not too high or low. o Check your blood sugar the morning of your surgery when you wake up and every 2 hours until you get to the Short Stay unit. . If your blood sugar is less than 70 mg/dL, you will need to treat for low blood sugar: o Do not take insulin. o Treat a low blood sugar (less than 70 mg/dL) with  cup of clear juice (cranberry or apple), 4 glucose tablets, OR glucose gel. o Recheck blood sugar in 15 minutes after treatment (to make sure it is greater than 70 mg/dL). If your blood sugar is not greater than 70 mg/dL on recheck, call [phone] for further instructions. . Report your blood sugar to the short stay nurse when you get to Short Stay.  . If you are admitted to the hospital after surgery: o Your blood sugar will be checked by the staff and you will probably be given insulin after surgery (instead of oral diabetes medicines) to make sure you have good blood sugar levels. o The goal for blood sugar control after surgery is 80-180 mg/dL.   WHAT DO I DO ABOUT MY DIABETES MEDICATION?  Marland Kitchen Do not take oral diabetes medicines (pills) the morning of surgery.Marland Kitchen  You may not have any metal on your body including               piercings  Do not wear jewelry, lotions, powders or deodorant                   Men may shave face and neck.   Do not bring valuables to the hospital. [place].  Contacts, dentures or bridgework may not be worn into surgery.  Leave suitcase in the car. After surgery it may be brought to your room.     Patients discharged the day of surgery will not be allowed to drive home.  IF YOU ARE HAVING SURGERY AND GOING HOME THE SAME DAY, YOU MUST HAVE AN ADULT TO DRIVE YOU HOME AND BE WITH YOU FOR 24 HOURS.  YOU MAY GO HOME BY TAXI OR UBER OR ORTHERWISE, BUT AN ADULT MUST ACCOMPANY YOU HOME AND STAY WITH YOU FOR 24 HOURS.  Name and  phone number of your driver:  Special Instructions: N/A              Please read over the following fact sheets you were given: _____________________________________________________________________             [facility] - Preparing for Surgery Before surgery, you can play an important role.   Because skin is not sterile, your skin needs to be as free of germs as possible.   You can reduce the number of germs on your skin by washing with CHG (chlorahexidine gluconate) soap before surgery.   CHG is an antiseptic cleaner which kills germs and bonds with the skin to continue killing germs even after washing. Please DO NOT use if you have an allergy to CHG or antibacterial soaps.   If your skin becomes reddened/irritated stop using the CHG and inform your nurse when you arrive at Short Stay.   You may shave your face/neck.  Please follow these instructions carefully:  1.  Shower with CHG Soap the night before surgery and the  morning of Surgery.  2.  If you choose to wash your hair, wash your hair first as usual with your  normal  shampoo.  3.  After you shampoo, rinse your hair and body thoroughly to remove the  shampoo.                                        4.  Use CHG as you would any other liquid soap.  You can apply chg directly  to the skin and wash                       Gently with a scrungie or clean washcloth.  5.  Apply the CHG Soap to your body ONLY FROM THE NECK DOWN.   Do not use on face/ open                           Wound or open sores. Avoid contact with eyes, ears mouth and genitals (private parts).                       Wash face,  Genitals (private parts) with your normal soap.  6.  Wash thoroughly, paying special attention to the area where your surgery  will be performed.  7.  Thoroughly rinse your body with warm water from the neck down.  8.  DO NOT shower/wash with your normal soap after using and rinsing off  the CHG Soap.             9.  Pat yourself  dry with a clean towel.            10.  Wear clean pajamas.            11.  Place clean sheets on your bed the night of your first shower and do not  sleep with pets. Day of Surgery : Do not apply any lotions/deodorants the morning of surgery.  Please wear clean clothes to the hospital/surgery center.  FAILURE TO FOLLOW THESE INSTRUCTIONS MAY RESULT IN THE CANCELLATION OF YOUR SURGERY PATIENT SIGNATURE_________________________________  NURSE SIGNATURE__________________________________  ________________________________________________________________________

## 2019-11-01 LAB — SARS CORONAVIRUS 2 (TAT 6-24 HRS): SARS Coronavirus 2: NEGATIVE

## 2019-11-02 NOTE — H&P (Signed)
Office Visit Report     10/17/2019   --------------------------------------------------------------------------------   Eugene Harding  MRN: [id number]  DOB: 1961-03-12, 59 year old Male  SSN:    PRIMARY CARE:  Ashish C. Manuella Ghazi, MD  REFERRING:    PROVIDER:  Festus Aloe, M.D.  LOCATION:  [facility] [phone]     --------------------------------------------------------------------------------   CC: I have blood in my urine.  HPI: Eugene Harding is a 59 year-old male established patient who is here for blood in the urine.  He did see the blood in his urine. He first noticed the symptoms 07/30/2019. He has seen blood clots.   CT 07/30/2019 revealed a medial left renal 1.6 cm hypochoic round lesion with HU 36 c/w possible hemorrhagic or proteinacious cyst and a filling defect at left UVJ either a tumor or ureterocele (no hydro). No LAD or bone mets.   Smoked for many years but stopped a few years ago. Remote textile work. On blood thinners for CVA.   UA clear. Renal US today with prominent area in left mid kidney but no hydro or definte mass. Bladder normal. 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     CC: Elevated PSA-Established Patient  HPI: His last PSA was performed 08/23/2019. The last PSA value was 4.28.   He thought his PSA was 4.5 - 5.5 but I don't have those results. He took abx for the PSA in the past.   Current 03/21 PSA 4.28. Prostate was symmetrically firm and flat.      ALLERGIES: None   MEDICATIONS: Lisinopril  Metformin Hcl  Amlodipine Besilate  Aspirin Ec 81 mg tablet, delayed release  Atorvastatin Calcium  Farxiga  Trazodone Hcl 50 mg tablet     GU PSH: None   NON-GU PSH: None   GU PMH: Bladder tumor/neoplasm, may be a bladder tumor vs left ureterocele - 08/23/2019 Elevated PSA, discussed nature of PSA elevation and PSa sent - 08/23/2019 Gross hematuria, UA clear. Needs cystoscopy - dscussed with patient . PSA sent and if he needs  something done in the OR we can add on TRUS prostate bx - [id number] Left uncertain neoplasm of kidney, check renal US for cyst - I drew him a picture of the anatomy - kidney, bladder and prostate - 08/23/2019    NON-GU PMH: Diabetes Type 2 Gout Hypertension Stroke/TIA    FAMILY HISTORY: father deceased - Other mother deceased - Other    Notes: mother died at age 44 from aneurism, father died at age 33 from a blood clot    SOCIAL HISTORY: Marital Status: Single Current Smoking Status: Patient does not smoke anymore. Has not smoked since 07/30/2016. Smoked for 15 years. Smoked 1 pack per day.   Tobacco Use Assessment Completed: Used Tobacco in last 30 days? Has never drank.  Drinks 2 caffeinated drinks per day.    REVIEW OF SYSTEMS:    GU Review Male:   Patient denies leakage of urine, penile pain, trouble starting your stream, stream starts and stops, erection problems, get up at night to urinate, have to strain to urinate , frequent urination, burning/ pain with urination, and hard to postpone urination.  Gastrointestinal (Upper):   Patient denies nausea, vomiting, and indigestion/ heartburn.  Gastrointestinal (Lower):   Patient denies diarrhea and constipation.  Constitutional:   Patient denies fever, night sweats, weight loss, and fatigue.  Skin:   Patient denies skin rash/ lesion and itching.  Eyes:   Patient denies blurred vision and  double vision.  Ears/ Nose/ Throat:   Patient denies sore throat and sinus problems.  Hematologic/Lymphatic:   Patient denies swollen glands and easy bruising.  Cardiovascular:   Patient denies leg swelling and chest pains.  Respiratory:   Patient denies cough and shortness of breath.  Endocrine:   Patient denies excessive thirst.  Musculoskeletal:   Patient reports back pain. Patient denies joint pain.  Neurological:   Patient denies headaches and dizziness.  Psychologic:   Patient denies depression and anxiety.   VITAL SIGNS:      10/17/2019  03:16 PM  BP 133/73 mmHg  Pulse 89 /min  Temperature 98.0 F / 36.6 C   GU PHYSICAL EXAMINATION:    Scrotum: No lesions. No edema. No cysts. No warts.  Testes: No tenderness, no swelling, no enlargement left testes. No tenderness, no swelling, no enlargement right testes. Normal location left testes. Normal location right testes. No mass, no cyst, no varicocele, no hydrocele left testes. No mass, no cyst, no varicocele, no hydrocele right testes.  Urethral Meatus: Normal size. No lesion, no wart, no discharge, no polyp. Normal location.  Penis: Circumcised, no warts, no cracks. No dorsal Peyronie's plaques, no left corporal Peyronie's plaques, no right corporal Peyronie's plaques, no scarring, no warts. No balanitis, no meatal stenosis.   MULTI-SYSTEM PHYSICAL EXAMINATION:    Constitutional: Well-nourished. No physical deformities. Normally developed. Good grooming.  Neck: Neck symmetrical, not swollen. Normal tracheal position.  Respiratory: No labored breathing, no use of accessory muscles.   Cardiovascular: Normal temperature, normal extremity pulses, no swelling, no varicosities.  Skin: No paleness, no jaundice, no cyanosis. No lesion, no ulcer, no rash.  Neurologic / Psychiatric: Oriented to time, oriented to place, oriented to person. No depression, no anxiety, no agitation.  Gastrointestinal: No mass, no tenderness, no rigidity, non obese abdomen.     Complexity of Data:  X-Ray Review: C.T. Abdomen/Pelvis: Reviewed Films. 07/2019    08/24/19  PSA  Total PSA 4.28 ng/mL  Free PSA 0.61 ng/mL  % Free PSA 14 % PSA    PROCEDURES:         Flexible Cystoscopy - 52000  Risks, benefits, and some of the potential complications of the procedure were discussed with the patient. All questions were answered. Informed consent was obtained. Antibiotic prophylaxis was given -- Cephalexin. Sterile technique and intraurethral analgesia were used.  Meatus:  Normal size. Normal location. Normal  condition.  Urethra:  No strictures.  External Sphincter:  Normal.  Verumontanum:  Normal.  Prostate:  Non-obstructing. No hyperplasia.  Bladder Neck:  Non-obstructing.  Ureteral Orifices:  Normal location. Normal size. Normal shape. Effluxed clear urine.  Bladder:  A left trigone tumor. No trabeculation. Normal mucosa. No stones.      The lower urinary tract was carefully examined. The procedure was well-tolerated and without complications. Antibiotic instructions were given. Instructions were given to call the office immediately for bloody urine, difficulty urinating, painful urination, fever, chills, nausea, vomiting or other illness. The patient stated that he understood these instructions and would comply with them.          Renal Ultrasound - [id number]  Right Kidney: Length: 12.4 cm Depth:5.3 cm Cortical Width: 1.5 cm Width:5.1 cm  Left Kidney: Length: 11.5 cm Depth:7.1 cm Cortical Width:1.6 cm Width:6.2 cm  Left Kidney/Ureter:  There is a probable lower pole non obstructing calc noted 3.8 mm. There was a question of an "interpolar nodule" in this kidney on recent CT. There is a subtle upper /  mid pole medial more prominent area noted 1.5 cm in the interpolar region.   Right Kidney/Ureter:  Normal right kidney. Normal right ureter.  Bladder:  PVR 8.7 ml      . Patient confirmed No Neulasta OnPro Device.           Urinalysis w/Scope Micro  WBC/hpf: NS (Not Seen)  RBC/hpf: NS (Not Seen)  Bacteria: NS (Not Seen)  Cystals: NS (Not Seen)  Casts: NS (Not Seen)  Trichomonas: Not Present  Mucous: Not Present  Epithelial Cells: NS (Not Seen)  Yeast: NS (Not Seen)  Sperm: Not Present    ASSESSMENT:      ICD-10 Details  1 GU:   Elevated PSA - R97.20 Chronic, Stable - Discussed PSA and we will set up for biopsy in OR.   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 Bx prostate as above.    PLAN:           Schedule Return Visit/Planned Activity: Next Available Appointment - Schedule Surgery          Document Letter(s):  Created for Patient: Clinical Summary    * Signed by Festus Aloe, M.D. on 10/17/19 at 8:52 PM (EDT*     The information contained in this medical record document is considered private and confidential patient information. This information can only be used for the medical diagnosis and/or medical services that are being provided by the patient's selected caregivers. This information can only be distributed outside of the patient's care if the patient agrees and signs waivers of authorization for this information to be sent to an outside source or route.

## 2019-11-03 ENCOUNTER — Ambulatory Visit (HOSPITAL_COMMUNITY): Payer: Medicare Other | Admitting: Physician Assistant

## 2019-11-03 ENCOUNTER — Telehealth (HOSPITAL_COMMUNITY): Payer: Self-pay | Admitting: *Deleted

## 2019-11-03 ENCOUNTER — Encounter (HOSPITAL_COMMUNITY): Payer: Self-pay | Admitting: Urology

## 2019-11-03 ENCOUNTER — Other Ambulatory Visit: Payer: Self-pay

## 2019-11-03 ENCOUNTER — Ambulatory Visit (HOSPITAL_COMMUNITY): Payer: Medicare Other | Admitting: Anesthesiology

## 2019-11-03 ENCOUNTER — Ambulatory Visit (HOSPITAL_COMMUNITY)
Admission: RE | Admit: 2019-11-03 | Discharge: 2019-11-03 | Disposition: A | Discharge status: Home / Self Care | Payer: Medicare Other | Attending: Urology | Admitting: Urology

## 2019-11-03 ENCOUNTER — Ambulatory Visit (HOSPITAL_COMMUNITY)
Admission: RE | Admit: 2019-11-03 | Discharge: 2019-11-03 | Disposition: A | Discharge status: Home / Self Care | Payer: Medicare Other | Source: Ambulatory Visit | Attending: Urology | Admitting: Urology

## 2019-11-03 ENCOUNTER — Ambulatory Visit (HOSPITAL_COMMUNITY): Admission: RE | Admit: 2019-11-03 | Payer: Medicare Other | Source: Ambulatory Visit | Admitting: Urology

## 2019-11-03 ENCOUNTER — Encounter (HOSPITAL_COMMUNITY)
Admission: RE | Disposition: A | Discharge status: Home / Self Care | Payer: Self-pay | Source: Home / Self Care | Attending: Urology

## 2019-11-03 DIAGNOSIS — E785 Hyperlipidemia, unspecified: Secondary | ICD-10-CM | POA: Diagnosis not present

## 2019-11-03 DIAGNOSIS — E119 Type 2 diabetes mellitus without complications: Secondary | ICD-10-CM | POA: Insufficient documentation

## 2019-11-03 DIAGNOSIS — M109 Gout, unspecified: Secondary | ICD-10-CM | POA: Diagnosis not present

## 2019-11-03 DIAGNOSIS — D414 Neoplasm of uncertain behavior of bladder: Secondary | ICD-10-CM

## 2019-11-03 DIAGNOSIS — C679 Malignant neoplasm of bladder, unspecified: Secondary | ICD-10-CM | POA: Insufficient documentation

## 2019-11-03 DIAGNOSIS — Z7901 Long term (current) use of anticoagulants: Secondary | ICD-10-CM | POA: Diagnosis not present

## 2019-11-03 DIAGNOSIS — I10 Essential (primary) hypertension: Secondary | ICD-10-CM | POA: Diagnosis not present

## 2019-11-03 DIAGNOSIS — Z79899 Other long term (current) drug therapy: Secondary | ICD-10-CM | POA: Diagnosis not present

## 2019-11-03 DIAGNOSIS — Z7982 Long term (current) use of aspirin: Secondary | ICD-10-CM | POA: Diagnosis not present

## 2019-11-03 DIAGNOSIS — C61 Malignant neoplasm of prostate: Secondary | ICD-10-CM | POA: Diagnosis present

## 2019-11-03 DIAGNOSIS — Z7984 Long term (current) use of oral hypoglycemic drugs: Secondary | ICD-10-CM | POA: Insufficient documentation

## 2019-11-03 DIAGNOSIS — R972 Elevated prostate specific antigen [PSA]: Secondary | ICD-10-CM

## 2019-11-03 DIAGNOSIS — Z6831 Body mass index (BMI) 31.0-31.9, adult: Secondary | ICD-10-CM | POA: Diagnosis not present

## 2019-11-03 DIAGNOSIS — Z87891 Personal history of nicotine dependence: Secondary | ICD-10-CM | POA: Insufficient documentation

## 2019-11-03 DIAGNOSIS — Z8673 Personal history of transient ischemic attack (TIA), and cerebral infarction without residual deficits: Secondary | ICD-10-CM | POA: Diagnosis not present

## 2019-11-03 DIAGNOSIS — E669 Obesity, unspecified: Secondary | ICD-10-CM | POA: Diagnosis not present

## 2019-11-03 DIAGNOSIS — D494 Neoplasm of unspecified behavior of bladder: Secondary | ICD-10-CM | POA: Diagnosis not present

## 2019-11-03 DIAGNOSIS — D090 Carcinoma in situ of bladder: Secondary | ICD-10-CM | POA: Diagnosis not present

## 2019-11-03 HISTORY — PX: TRANSURETHRAL RESECTION OF BLADDER TUMOR: SHX2575

## 2019-11-03 HISTORY — PX: PROSTATE BIOPSY: SHX241

## 2019-11-03 LAB — GLUCOSE, CAPILLARY
Glucose-Capillary: 155 mg/dL — ABNORMAL HIGH (ref 70–99)
Glucose-Capillary: 166 mg/dL — ABNORMAL HIGH (ref 70–99)

## 2019-11-03 SURGERY — TURBT (TRANSURETHRAL RESECTION OF BLADDER TUMOR)
Anesthesia: General | Site: Prostate

## 2019-11-03 MED ORDER — DEXAMETHASONE SODIUM PHOSPHATE 10 MG/ML IJ SOLN
INTRAMUSCULAR | Status: DC | PRN
  Administered 2019-11-03: 10 mg via INTRAVENOUS

## 2019-11-03 MED ORDER — PROPOFOL 10 MG/ML IV BOLUS
INTRAVENOUS | Status: DC | PRN
  Administered 2019-11-03: 150 mg via INTRAVENOUS

## 2019-11-03 MED ORDER — FENTANYL CITRATE (PF) 100 MCG/2ML IJ SOLN
INTRAMUSCULAR | Status: AC
  Filled 2019-11-03: qty 2

## 2019-11-03 MED ORDER — SODIUM CHLORIDE 0.9 % IR SOLN
Status: DC | PRN
  Administered 2019-11-03 (×2): 3000 mL

## 2019-11-03 MED ORDER — FENTANYL CITRATE (PF) 100 MCG/2ML IJ SOLN
25.0000 ug | INTRAMUSCULAR | Status: DC | PRN
  Administered 2019-11-03: 50 ug via INTRAVENOUS

## 2019-11-03 MED ORDER — PHENYLEPHRINE 40 MCG/ML (10ML) SYRINGE FOR IV PUSH (FOR BLOOD PRESSURE SUPPORT)
PREFILLED_SYRINGE | INTRAVENOUS | Status: AC
  Filled 2019-11-03: qty 10

## 2019-11-03 MED ORDER — DEXAMETHASONE SODIUM PHOSPHATE 10 MG/ML IJ SOLN
INTRAMUSCULAR | Status: AC
  Filled 2019-11-03: qty 1

## 2019-11-03 MED ORDER — GEMCITABINE CHEMO FOR BLADDER INSTILLATION 2000 MG
2000.0000 mg | Freq: Once | INTRAVENOUS | Status: AC
  Administered 2019-11-03: 2000 mg via INTRAVESICAL
  Filled 2019-11-03: qty 2000

## 2019-11-03 MED ORDER — FLEET ENEMA 7-19 GM/118ML RE ENEM: 1.0000 | ENEMA | Freq: Once | RECTAL | Status: DC

## 2019-11-03 MED ORDER — ONDANSETRON HCL 4 MG/2ML IJ SOLN: 4.0000 mg | Freq: Once | INTRAMUSCULAR | Status: DC | PRN

## 2019-11-03 MED ORDER — LIDOCAINE 2% (20 MG/ML) 5 ML SYRINGE
INTRAMUSCULAR | Status: DC | PRN
  Administered 2019-11-03: 100 mg via INTRAVENOUS

## 2019-11-03 MED ORDER — MEPERIDINE HCL 50 MG/ML IJ SOLN: 6.2500 mg | INTRAMUSCULAR | Status: DC | PRN

## 2019-11-03 MED ORDER — ONDANSETRON HCL 4 MG/2ML IJ SOLN
INTRAMUSCULAR | Status: AC
  Filled 2019-11-03: qty 2

## 2019-11-03 MED ORDER — ACETAMINOPHEN 325 MG PO TABS: 325.0000 mg | ORAL_TABLET | ORAL | Status: DC | PRN

## 2019-11-03 MED ORDER — ACETAMINOPHEN 160 MG/5ML PO SOLN: 325.0000 mg | ORAL | Status: DC | PRN

## 2019-11-03 MED ORDER — OXYCODONE HCL 5 MG PO TABS: 5.0000 mg | ORAL_TABLET | Freq: Once | ORAL | Status: DC | PRN

## 2019-11-03 MED ORDER — ONDANSETRON HCL 4 MG/2ML IJ SOLN
INTRAMUSCULAR | Status: DC | PRN
  Administered 2019-11-03: 4 mg via INTRAVENOUS

## 2019-11-03 MED ORDER — MIDAZOLAM HCL 5 MG/5ML IJ SOLN
INTRAMUSCULAR | Status: DC | PRN
  Administered 2019-11-03: 2 mg via INTRAVENOUS

## 2019-11-03 MED ORDER — LIDOCAINE 2% (20 MG/ML) 5 ML SYRINGE
INTRAMUSCULAR | Status: AC
  Filled 2019-11-03: qty 5

## 2019-11-03 MED ORDER — FENTANYL CITRATE (PF) 100 MCG/2ML IJ SOLN
INTRAMUSCULAR | Status: AC
  Administered 2019-11-03: 50 ug via INTRAVENOUS
  Filled 2019-11-03: qty 4

## 2019-11-03 MED ORDER — MIDAZOLAM HCL 2 MG/2ML IJ SOLN
INTRAMUSCULAR | Status: AC
  Filled 2019-11-03: qty 2

## 2019-11-03 MED ORDER — FENTANYL CITRATE (PF) 100 MCG/2ML IJ SOLN
INTRAMUSCULAR | Status: DC | PRN
  Administered 2019-11-03 (×4): 25 ug via INTRAVENOUS

## 2019-11-03 MED ORDER — PROPOFOL 10 MG/ML IV BOLUS
INTRAVENOUS | Status: AC
  Filled 2019-11-03: qty 20

## 2019-11-03 MED ORDER — ACETAMINOPHEN 10 MG/ML IV SOLN: 1000.0000 mg | Freq: Once | INTRAVENOUS | Status: DC | PRN

## 2019-11-03 MED ORDER — ORAL CARE MOUTH RINSE: 15.0000 mL | Freq: Once | OROMUCOSAL | Status: AC

## 2019-11-03 MED ORDER — OXYCODONE HCL 5 MG/5ML PO SOLN: 5.0000 mg | Freq: Once | ORAL | Status: DC | PRN

## 2019-11-03 MED ORDER — CEFAZOLIN SODIUM-DEXTROSE 2-4 GM/100ML-% IV SOLN
2.0000 g | Freq: Once | INTRAVENOUS | Status: AC
  Administered 2019-11-03: 2 g via INTRAVENOUS
  Filled 2019-11-03: qty 100

## 2019-11-03 MED ORDER — CHLORHEXIDINE GLUCONATE 0.12 % MT SOLN
15.0000 mL | Freq: Once | OROMUCOSAL | Status: AC
  Administered 2019-11-03: 15 mL via OROMUCOSAL

## 2019-11-03 MED ORDER — PHENYLEPHRINE 40 MCG/ML (10ML) SYRINGE FOR IV PUSH (FOR BLOOD PRESSURE SUPPORT)
PREFILLED_SYRINGE | INTRAVENOUS | Status: DC | PRN
  Administered 2019-11-03: 120 ug via INTRAVENOUS
  Administered 2019-11-03: 80 ug via INTRAVENOUS

## 2019-11-03 MED ORDER — LACTATED RINGERS IV SOLN: INTRAVENOUS | Status: DC

## 2019-11-03 SURGICAL SUPPLY — 17 items
BAG URINE DRAIN 2000ML AR STRL (UROLOGICAL SUPPLIES) IMPLANT
BAG URO CATCHER STRL LF (MISCELLANEOUS) ×3 IMPLANT
COVER SURGICAL LIGHT HANDLE (MISCELLANEOUS) ×3 IMPLANT
COVER WAND RF STERILE (DRAPES) IMPLANT
GLOVE BIOGEL M STRL SZ7.5 (GLOVE) ×3 IMPLANT
GOWN STRL REUS W/TWL XL LVL3 (GOWN DISPOSABLE) ×3 IMPLANT
INST BIOPSY MAXCORE 18GX25 (NEEDLE) ×3 IMPLANT
KIT TURNOVER KIT A (KITS) IMPLANT
LOOP CUT BIPOLAR 24F LRG (ELECTROSURGICAL) ×3 IMPLANT
MANIFOLD NEPTUNE II (INSTRUMENTS) ×3 IMPLANT
PENCIL SMOKE EVACUATOR (MISCELLANEOUS) IMPLANT
SYR CONTROL 10ML LL (SYRINGE) IMPLANT
TRAY CYSTO PACK (CUSTOM PROCEDURE TRAY) ×3 IMPLANT
TUBING CONNECTING 10 (TUBING) IMPLANT
TUBING INSUFFLATION 10FT LAP (TUBING) IMPLANT
TUBING UROLOGY SET (TUBING) ×3 IMPLANT
UNDERPAD 30X36 HEAVY ABSORB (UNDERPADS AND DIAPERS) ×3 IMPLANT

## 2019-11-03 NOTE — Anesthesia Preprocedure Evaluation (Signed)
Anesthesia Evaluation  Patient identified by MRN, date of birth, ID band Patient awake    Reviewed: Allergy & Precautions, NPO status , Patient's Chart, lab work & pertinent test results  Airway Mallampati: I       Dental no notable dental hx. (+) Teeth Intact   Pulmonary former smoker,    Pulmonary exam normal breath sounds clear to auscultation       Cardiovascular hypertension, Pt. on medications Normal cardiovascular exam Rhythm:Regular Rate:Normal     Neuro/Psych CVA, No Residual Symptoms negative psych ROS   GI/Hepatic Neg liver ROS,   Endo/Other  diabetes, Type 2, Oral Hypoglycemic Agents  Renal/GU negative Renal ROS  negative genitourinary   Musculoskeletal negative musculoskeletal ROS (+)   Abdominal (+) + obese,   Peds  Hematology negative hematology ROS (+)   Anesthesia Other Findings   Reproductive/Obstetrics                             Anesthesia Physical Anesthesia Plan  ASA: II  Anesthesia Plan: General   Post-op Pain Management:    Induction: Intravenous  PONV Risk Score and Plan: 4 or greater and Ondansetron and Midazolam  Airway Management Planned: LMA  Additional Equipment: None  Intra-op Plan:   Post-operative Plan: Extubation in OR  Informed Consent: I have reviewed the patients History and Physical, chart, labs and discussed the procedure including the risks, benefits and alternatives for the proposed anesthesia with the patient or authorized representative who has indicated his/her understanding and acceptance.     Dental advisory given  Plan Discussed with: CRNA  Anesthesia Plan Comments:         Anesthesia Quick Evaluation

## 2019-11-03 NOTE — Discharge Instructions (Signed)
Transrectal Ultrasound-Guided Prostate Biopsy, Care After This sheet gives you information about how to care for yourself after your procedure. Your doctor may also give you more specific instructions. If you have problems or questions, contact your doctor. What can I expect after the procedure? After the procedure, it is common to have:  Pain and discomfort in your butt, especially while sitting.  Pink-colored pee (urine), due to small amounts of blood in the pee.  Burning while peeing (urinating).  Blood in your poop (stool).  Bleeding from your butt.  Blood in your semen. Follow these instructions at home: Medicines  Take over-the-counter and prescription medicines only as told by your doctor.  If you were prescribed antibiotic medicine, take it as told by your doctor. Do not stop taking the antibiotic even if you start to feel better. Activity   Do not drive for 24 hours if you were given a medicine to help you relax (sedative) during your procedure.  Return to your normal activities as told by your doctor. Ask your doctor what activities are safe for you.  Ask your doctor when it is okay for you to have sex.  Do not lift anything that is heavier than 10 lb (4.5 kg), or the limit that you are told, until your doctor says that it is safe. General instructions   Drink enough water to keep your pee pale yellow.  Watch your pee, poop, and semen for new bleeding or bleeding that gets worse.  Keep all follow-up visits as told by your doctor. This is important. Contact a doctor if you:  Have blood clots in your pee or poop.  Notice that your pee smells bad or unusual.  Have very bad belly pain.  Have trouble peeing.  Notice that your lower belly feels firm.  Have blood in your pee for more than 2 weeks after the procedure.  Have blood in your semen for more than 2 months after the procedure.  Have problems getting an erection.  Feel sick to your stomach  (nauseous).  Throw up (vomit).  Have new or worse bleeding in your pee, poop, or semen. Get help right away if you:  Have a fever or chills.  Have bright red pee.  Have very bad pain that does not get better with medicine.  Cannot pee. Summary  After this procedure, it is common to have pain and discomfort around your butt, especially while sitting.  You may have blood in your pee and poop.  It is common to have blood in your semen for 1-2 months.  If you were prescribed antibiotic medicine, take it as told by your doctor. Do not stop taking the antibiotic even if you start to feel better.  Get help right away if you have a fever or chills. This information is not intended to replace advice given to you by your health care provider. Make sure you discuss any questions you have with your health care provider. Document Revised: 09/07/2018 Document Reviewed: 03/16/2017 Elsevier Patient Education  [place].  Bladder Biopsy, Care After This sheet gives you information about how to care for yourself after your procedure. Your health care provider may also give you more specific instructions. If you have problems or questions, contact your health care provider. What can I expect after the procedure? After the procedure, it is common to have:  Mild pain in your bladder or kidney area during urination.  Minor burning during urination.  Small amounts of blood in your  urine.  A sudden urge to urinate.  A need to urinate more often than usual. Follow these instructions at home: Medicines  Take over-the-counter and prescription medicines only as told by your health care provider.  If you were prescribed an antibiotic medicine, take it as told by your health care provider. Do not stop taking the antibiotic even if you start to feel better. Activity  Rest if told by your health care provider.  Do not drive for 24 hours if you received a medicine to help you relax  (sedative) during your procedure. Ask your health care provider when it is safe for you to drive.  Return to your normal activities as told by your health care provider. Ask your health care provider what activities are safe for you. General instructions   Take a warm bath to relieve any burning sensations around your urethra.  Hold a warm, damp washcloth over the urethral area to ease pain.  It is up to you to get the results of your procedure. Ask your health care provider, or the department that is doing the procedure, when your results will be ready.  Keep all follow-up visits as told by your health care provider. This is important. Contact a health care provider if:  You have a fever.  Your symptoms do not improve within 24 hours, and you continue to have: ? Burning during urination. ? Increasing amounts of blood in your urine. ? Pain during urination. ? An urgent need to urinate. ? A need to urinate more often than usual. Get help right away if:  You have a lot of bleeding or more bleeding.  You have severe pain.  You are unable to urinate.  You have bright red blood in your urine.  You are passing blood clots in your urine.  You have a fever. Summary  After the procedure, it is common to have mild pain, burning with urination, and some blood.  Take medicines as told. If you were given antibiotics, finish all of it even if you start to feel better.  Rest after the procedure. Follow your health care provider's instructions for self care at home.  Contact a health care provider if your symptoms do not improve within 24 hours, or if you have more pain or more blood in your urine.  Get help right away if you have a lot of bleeding, severe pain, fever, or bright red blood or blood clots in the urine. This information is not intended to replace advice given to you by your health care provider. Make sure you discuss any questions you have with your health care  provider. Document Revised: 11/23/2018 Document Reviewed: 11/23/2018 Elsevier Patient Education  [place].

## 2019-11-03 NOTE — Transfer of Care (Signed)
Immediate Anesthesia Transfer of Care Note  Patient: Eugene Harding  Procedure(s) Performed: TRANSURETHRAL RESECTION OF BLADDER TUMOR (TURBT) 0.5-2.0 cm, POSSIBLE LEFT STENT PLACEMENT (N/A Bladder) BIOPSY TRANSRECTAL ULTRASONIC PROSTATE (TUBP) (N/A Prostate)  Patient Location: PACU  Anesthesia Type:General  Level of Consciousness: drowsy  Airway & Oxygen Therapy: Patient Spontanous Breathing and Patient connected to face mask oxygen  Post-op Assessment: Report given to RN and Post -op Vital signs reviewed and stable  Post vital signs: Reviewed and stable  Last Vitals:  Vitals Value Taken Time  BP 143/73 11/03/19 1054  Temp    Pulse 84 11/03/19 1055  Resp 13 11/03/19 1055  SpO2 99 % 11/03/19 1055  Vitals shown include unvalidated device data.  Last Pain:  Vitals:   11/03/19 0928  TempSrc:   PainSc: 0-No pain      Patients Stated Pain Goal: 4 (79/72/82 0601)  Complications: No apparent anesthesia complications

## 2019-11-03 NOTE — Interval H&P Note (Signed)
History and Physical Interval Note:  11/03/2019 9:55 AM  Eugene Harding  has presented today for surgery, with the diagnosis of BLADDER NEOPLASM, ELEVATED PROSTATE SPECIFIC ANTIGEN.  The various methods of treatment have been discussed with the patient and family. After consideration of risks, benefits and other options for treatment, the patient has consented to  Procedure(s): TRANSURETHRAL RESECTION OF BLADDER TUMOR (TURBT), POSSIBLE LEFT STENT PLACEMENT (N/A) BIOPSY TRANSRECTAL ULTRASONIC PROSTATE (TUBP) (N/A), GEMCITABINE in PACU if indicated as a surgical intervention.  The patient's history has been reviewed, patient examined, no change in status, stable for surgery. No dysuria or fever.  He noticed gross hematuria after the office cystoscopy.  We discussed what to expect postoperatively among other risk. I have reviewed the patient's chart and labs.  Questions were answered to the patient's satisfaction.     Festus Aloe

## 2019-11-03 NOTE — Op Note (Signed)
Preoperative diagnosis: Bladder neoplasm of uncertain malignant potential, elevated PSA Postoperative diagnosis: Bladder neoplasm of uncertain malignant potential, elevated PSA  Procedure: Cystoscopy, TURBT 2 to 5 cm, transrectal ultrasound of prostate, prostate biopsy, instillation of gemcitabine in PACU  Surgeon: Junious Silk  Anesthesia: General  Indication for procedure: Mr. Eugene Harding is a 59 year old male with a history of gross hematuria and elevated PSA.  He was found to have a papillary tumor about the left trigone in the office.  He is brought today for above procedures.  Findings: On cystoscopy the urethra and prostate were unremarkable.  Trigone and ureteral orifices were in their normal orthotopic position with clear efflux.  I initially could not see the left ureteral orifice as the tumor was just a few millimeters lateral to it.  It was not involved in the resection.  There were no other bladder tumors.  No stone or foreign body in the bladder.  On digital rectal exam prostate was about 30 g and smooth without hard area or nodule.  Transrectal ultrasound of prostate.  Prostate appeared normal, seminal vesicles appeared normal, prostate capsule appeared normal.  Prostate measured in 4.69 cm length, 4.72 cm width, 3.20 cm height for a total volume of 37.09 mL.   Description of procedure: After consent was obtained patient brought to the operating room.  After adequate anesthesia was placed lithotomy position and prepped and draped in the usual sterile fashion.  Timeout was performed to confirm the patient and procedure.  Cystoscope was passed per urethra and the bladder inspected with 30 degree and 70 degree lens.  I then switched out for the continuous flow sheath with the visual obturator and then passed the handle and loop.  Loop electrode was used to resect the tumor down to the base.  Base was just lateral to left ureteral orifice but did appear to be superficial narrow base.  Resection  completed and hemostasis ensured.  Left ureteral orifice not involved in clear reflux was noted.  Specimen was collected and sent to pathology.  The scope was removed and a 19 French Foley placed in left to gravity drainage.  Urine was clear.  Digital rectal exam was performed.  Ultrasound probe passed per rectum and transrectal ultrasound of prostate completed.  Standard 12 core prostate biopsy was obtained.  Minimal bleeding.  Patient was then awakened and taken to recovery room in stable condition.  Instillation of gemcitabine in PACU: 2000 mg of gemcitabine was instilled per urethra and left indwelling for 50 minutes and then drained.  Foley catheter removed.  Complications: None  Blood loss: Minimal  Specimens to pathology: #1 right base lateral #2 right base medial #3 right mid lateral #4 right mid medial #5 right apex lateral #6 right apex medial #7 left base lateral #8 left base medial #9 left mid lateral #10 left mid medial #11 left apex lateral #12 left apex medial #13 bladder tumor left trigone  Drains: 7 French Foley catheter for gravity drainage  Disposition: Patient stable to PACU

## 2019-11-06 ENCOUNTER — Encounter: Payer: Self-pay | Admitting: *Deleted

## 2019-11-06 LAB — SURGICAL PATHOLOGY

## 2019-11-10 NOTE — Anesthesia Postprocedure Evaluation (Signed)
Anesthesia Post Note  Patient: Eugene Harding  Procedure(s) Performed: TRANSURETHRAL RESECTION OF BLADDER TUMOR (TURBT) 0.5-2.0 cm, POSSIBLE LEFT STENT PLACEMENT (N/A Bladder) BIOPSY TRANSRECTAL ULTRASONIC PROSTATE (TUBP) (N/A Prostate)     Patient location during evaluation: PACU Anesthesia Type: General Level of consciousness: awake Pain management: pain level controlled Vital Signs Assessment: post-procedure vital signs reviewed and stable Respiratory status: spontaneous breathing Cardiovascular status: stable Postop Assessment: no apparent nausea or vomiting Anesthetic complications: no   No complications documented.  Last Vitals:  Vitals:   11/03/19 1230 11/03/19 1240  BP: 114/68 (!) 143/74  Pulse: 79 88  Resp: 17 16  Temp:  36.6 C  SpO2: 97% 98%    Last Pain:  Vitals:   11/03/19 1240  TempSrc:   PainSc: 0-No pain                 Huston Foley

## 2019-11-23 DIAGNOSIS — C670 Malignant neoplasm of trigone of bladder: Secondary | ICD-10-CM | POA: Diagnosis not present

## 2019-12-12 DIAGNOSIS — D4102 Neoplasm of uncertain behavior of left kidney: Secondary | ICD-10-CM | POA: Diagnosis not present

## 2019-12-12 DIAGNOSIS — R8271 Bacteriuria: Secondary | ICD-10-CM | POA: Diagnosis not present

## 2019-12-12 DIAGNOSIS — Z8551 Personal history of malignant neoplasm of bladder: Secondary | ICD-10-CM | POA: Diagnosis not present

## 2019-12-13 ENCOUNTER — Other Ambulatory Visit: Payer: Self-pay | Admitting: Urology

## 2019-12-13 DIAGNOSIS — D4102 Neoplasm of uncertain behavior of left kidney: Secondary | ICD-10-CM

## 2019-12-15 DIAGNOSIS — Z713 Dietary counseling and surveillance: Secondary | ICD-10-CM | POA: Diagnosis not present

## 2019-12-15 DIAGNOSIS — E1165 Type 2 diabetes mellitus with hyperglycemia: Secondary | ICD-10-CM | POA: Diagnosis not present

## 2019-12-15 DIAGNOSIS — Z299 Encounter for prophylactic measures, unspecified: Secondary | ICD-10-CM | POA: Diagnosis not present

## 2019-12-15 DIAGNOSIS — I10 Essential (primary) hypertension: Secondary | ICD-10-CM | POA: Diagnosis not present

## 2019-12-19 ENCOUNTER — Encounter: Payer: Self-pay | Admitting: *Deleted

## 2019-12-20 ENCOUNTER — Encounter: Payer: Self-pay | Admitting: Cardiology

## 2019-12-20 ENCOUNTER — Ambulatory Visit (INDEPENDENT_AMBULATORY_CARE_PROVIDER_SITE_OTHER): Payer: Medicare Other | Admitting: Cardiology

## 2019-12-20 VITALS — BP 122/68 | HR 90 | Ht 64.0 in | Wt 186.0 lb

## 2019-12-20 DIAGNOSIS — Z8673 Personal history of transient ischemic attack (TIA), and cerebral infarction without residual deficits: Secondary | ICD-10-CM | POA: Diagnosis not present

## 2019-12-20 DIAGNOSIS — I10 Essential (primary) hypertension: Secondary | ICD-10-CM

## 2019-12-20 DIAGNOSIS — E119 Type 2 diabetes mellitus without complications: Secondary | ICD-10-CM | POA: Diagnosis not present

## 2019-12-20 DIAGNOSIS — Z01810 Encounter for preprocedural cardiovascular examination: Secondary | ICD-10-CM

## 2019-12-20 NOTE — Patient Instructions (Addendum)
Medication Instructions:   Your physician recommends that you continue on your current medications as directed. Please refer to the Current Medication list given to you today.  Labwork:  NONE  Testing/Procedures:  NONE  Follow-Up:  Your physician recommends that you schedule a follow-up appointment in: as needed.   Any Other Special Instructions Will Be Listed Below (If Applicable).  If you need a refill on your cardiac medications before your next appointment, please call your pharmacy. 

## 2019-12-20 NOTE — Progress Notes (Signed)
Cardiology Office Note  Date: 12/20/2019   ID: Eugene Harding, DOB [id number], MRN [id number]  PCP:  Monico Blitz, MD  Cardiologist:  Rozann Lesches, MD Electrophysiologist:  None   Chief Complaint  Patient presents with  . Preoperative cardiac evaluation    History of Present Illness: Eugene Harding is a 59 y.o. male seen in consultation back in October 2018.  He is referred back to the office by Dr. Alinda Money with [facility].  He is status post TURBT and TRUS biopsy of the prostate in June, ultimately found to have bladder cancer.  He was treated postoperatively with gemcitabine, also has evidence of prostate cancer and a left renal neoplasm of uncertain etiology.  He is referred for preoperative cardiac evaluation prior to anticipated radical prostatectomy.  He does not report any active cardiac symptoms such as angina or unusual shortness of breath.  He has chronic dizziness following previous stroke, no significant change in symptoms, no syncope.  I personally reviewed his ECG today which shows sinus rhythm with leftward axis, decreased R wave progression.  He had an echocardiogram done in 2018 that revealed normal LVEF in the range of 55 to 60%.  He does not report any orthopnea or PND.  I reviewed his current medications which are outlined below.  He is not on insulin.  Recent creatinine was normal. RCRI perioperative cardiac risk calculator overall intermediate range at class III, 6.6% chance of major adverse cardiac event.  We discussed this today.  Past Medical History:  Diagnosis Date  . Bladder cancer ([place])   . Essential hypertension   . History of stroke    Dizziness and numbness in left hand and leg  . Prostate cancer ([place])   . Type 2 diabetes mellitus ([place])     Past Surgical History:  Procedure Laterality Date  . NECK EXPLORATION  1981  . NO PAST SURGERIES    . PROSTATE BIOPSY N/A 11/03/2019   Procedure: BIOPSY TRANSRECTAL ULTRASONIC PROSTATE (TUBP);  Surgeon:  Festus Aloe, MD;  Location: [facility];  Service: Urology;  Laterality: N/A;  . TRANSURETHRAL RESECTION OF BLADDER TUMOR N/A 11/03/2019   Procedure: TRANSURETHRAL RESECTION OF BLADDER TUMOR (TURBT) 0.5-2.0 cm, POSSIBLE LEFT STENT PLACEMENT;  Surgeon: Festus Aloe, MD;  Location: [facility];  Service: Urology;  Laterality: N/A;    Current Outpatient Medications  Medication Sig Dispense Refill  . amLODipine (NORVASC) 5 MG tablet Take 5 mg by mouth daily.     Marland Kitchen aspirin 81 MG chewable tablet Chew 1 tablet (81 mg total) by mouth daily. 30 tablet 0  . atorvastatin (LIPITOR) 80 MG tablet Take 0.5 tablets (40 mg total) by mouth daily at 6 PM. 45 tablet 3  . Blood Glucose Monitoring Suppl (ONETOUCH VERIO) w/Device KIT USE TO TEST BLOOD SUGAR  3  . empagliflozin (JARDIANCE) 25 MG TABS tablet Take 25 mg by mouth daily.    Marland Kitchen glucose blood test strip 1 each by Other route as needed for other. Use as instructed    . lisinopril (PRINIVIL,ZESTRIL) 40 MG tablet Take 40 mg by mouth daily.    . metFORMIN (GLUCOPHAGE) 1000 MG tablet Take 1,000 mg by mouth 2 (two) times daily with a meal.     . ONETOUCH DELICA LANCETS FINE MISC USE TO TEST BLOOD SUGAR ONCE DAILY  2   No current facility-administered medications for this visit.   Allergies:  Patient has no known allergies.   Social History: The patient  reports that he  quit smoking about 2 years ago. His smoking use included cigarettes. He smoked 1.00 pack per day. He has never used smokeless tobacco. He reports that he does not drink alcohol and does not use drugs.   Family History: The patient's family history includes Heart disease in his brother.   ROS:  No palpitations or syncope.  Physical Exam: VS:  BP 122/68   Pulse 90   Ht 5' 4"  (1.626 m)   Wt 186 lb (84.4 kg)   SpO2 98%   BMI 31.93 kg/m , BMI Body mass index is 31.93 kg/m.  Wt Readings from Last 3 Encounters:  12/20/19 186 lb (84.4 kg)  11/03/19 185 lb 3 oz (84 kg)  10/31/19 185 lb 3 oz  (84 kg)    General: Patient appears comfortable at rest. HEENT: Conjunctiva and lids normal, wearing a mask. Neck: Supple, no elevated JVP or carotid bruits, no thyromegaly. Lungs: Clear to auscultation, nonlabored breathing at rest. Cardiac: Regular rate and rhythm, no S3 or significant systolic murmur, no pericardial rub. Abdomen: Soft, nontender, bowel sounds present. Extremities: No pitting edema, distal pulses 2+.  ECG:  An ECG dated 10/31/2019 was personally reviewed today and demonstrated:  Sinus rhythm with left anterior fascicular block.  Recent Labwork: 10/31/2019: BUN 21; Creatinine, Ser 0.91; Hemoglobin 15.8; Platelets 152; Potassium 4.5; Sodium 142     Component Value Date/Time   CHOL 168 04/02/2017 1010   TRIG 260 (H) 04/02/2017 1010   HDL 32 (L) 04/02/2017 1010   CHOLHDL 5.3 04/02/2017 1010   VLDL 52 (H) 04/02/2017 1010   LDLCALC 84 04/02/2017 1010  July 2021: BUN 16, creatinine 0.6  Other Studies Reviewed Today:  Echocardiogram 03/15/2017: Study Conclusions   - Left ventricle: The cavity size was normal. Wall thickness was  increased in a pattern of mild LVH. Systolic function was normal.  The estimated ejection fraction was in the range of 55% to 60%.  Wall motion was normal; there were no regional wall motion  abnormalities. Doppler parameters are consistent with abnormal  left ventricular relaxation (grade 1 diastolic dysfunction).  - Aortic valve: Trileaflet; mildly calcified leaflets.  - Mitral valve: Mildly calcified annulus. There was trivial  regurgitation.  - Right atrium: Central venous pressure (est): 3 mm Hg.  - Atrial septum: No defect or patent foramen ovale was identified.  - Tricuspid valve: There was trivial regurgitation.  - Pulmonary arteries: Systolic pressure could not be accurately  estimated.  - Pericardium, extracardiac: There was no pericardial effusion.   Impressions:   - Mild LVH with LVEF 55-60% and grade 1 diastolic  dysfunction.  Mildly calcified mitral annulus with trivial mitral  regurgitation. Mildly calcified aortic valve. Trivial tricuspid  regurgitation.   Assessment and Plan:  1.  Preoperative cardiac evaluation in a 59 year old male with prior history of stroke, hypertension, and type 2 diabetes mellitus.  He is being considered for radical prostatectomy under general anesthesia, recently tolerated bladder surgery as discussed above.  He does not describe any obvious angina symptoms, no breathlessness beyond NYHA class II with activities meeting or exceeding 4 METS, no palpitations or syncope.  ECG reviewed and nonspecific.  He is currently not on insulin and creatinine is normal.  RCRI perioperative cardiac risk calculator is intermediate range, class III with 6.6% chance of major adverse cardiac event.  We discussed this and I would not anticipate any significant cardiac barrier to him proceeding with planned operation at this time.  2.  Previous history of stroke in  2018.  He has chronic dizziness associated with this.  He is on statin therapy and aspirin.  3.  Essential hypertension, blood pressure is well controlled today.  He is on Norvasc and lisinopril.  4.  Type 2 diabetes mellitus followed by Dr. Manuella Ghazi.  He continues on Jardiance and Glucophage.  Medication Adjustments/Labs and Tests Ordered: Current medicines are reviewed at length with the patient today.  Concerns regarding medicines are outlined above.   Tests Ordered: Orders Placed This Encounter  Procedures  . EKG 12-Lead    Medication Changes: No orders of the defined types were placed in this encounter.   Disposition:  Follow up as needed.  Signed, Satira Sark, MD, [facility] 12/20/2019 10:10 AM    [facility] [place], [place], [place] [address] Phone: [phone]; Fax: [phone]

## 2019-12-21 ENCOUNTER — Ambulatory Visit: Payer: Medicare Other | Admitting: Family Medicine

## 2019-12-21 ENCOUNTER — Telehealth: Payer: Self-pay | Admitting: Cardiology

## 2019-12-21 NOTE — Telephone Encounter (Signed)
   [facility] Pre-operative Risk Assessment    HEARTCARE STAFF: - Please ensure there is not already an duplicate clearance open for this procedure. - Under Visit Info/Reason for Call, type in Other and utilize the format Clearance MM/DD/YY or Clearance TBD. Do not use dashes or single digits. - If request is for dental extraction, please clarify the # of teeth to be extracted.  Request for surgical clearance:  1. What type of surgery is being performed? Robot assisted laparoscopic radical prostatectomy  2. When is this surgery scheduled? TBD  3. What type of clearance is required (medical clearance vs. Pharmacy clearance to hold med vs. Both)? Both  4. Are there any medications that need to be held prior to surgery and how long? Plavix held 5 days prior, aspirin held 5 days prior  5. Practice name and name of physician performing surgery? [facility], Dr. Raynelle Bring  6. What is the office phone number? [phone]   7.   What is the office fax number? [phone]  8.   Anesthesia type (None, local, MAC, general) ? general   Eugene Harding 12/21/2019, 12:42 PM  _________________________________________________________________   (provider comments below)

## 2019-12-22 NOTE — Telephone Encounter (Signed)
   Primary Cardiologist: Rozann Lesches, MD  Chart reviewed as part of pre-operative protocol coverage. Given past medical history and time since last visit, based on ACC/AHA guidelines, Eugene Harding would be at acceptable risk for the planned procedure without further cardiovascular testing.   Patient's aspirin prescribed by PCP not cardiology service.  Patient will need to receive recommendations for holding aspirin from PCP.  He is not taking any other blood thinning agents for cardiac reasons.  I will route this recommendation to the requesting party via Epic fax function and remove from pre-op pool.  Please call with questions.  Eugene Ng. [name] NP-C    12/22/2019, 8:33 AM [address] Office [phone] Fax [phone]

## 2019-12-29 ENCOUNTER — Other Ambulatory Visit: Payer: Self-pay | Admitting: Urology

## 2020-01-05 DIAGNOSIS — M6281 Muscle weakness (generalized): Secondary | ICD-10-CM | POA: Diagnosis not present

## 2020-01-06 ENCOUNTER — Ambulatory Visit
Admission: RE | Admit: 2020-01-06 | Discharge: 2020-01-06 | Disposition: A | Discharge status: Home / Self Care | Payer: Medicare Other | Source: Ambulatory Visit | Attending: Urology | Admitting: Urology

## 2020-01-06 ENCOUNTER — Other Ambulatory Visit: Payer: Self-pay

## 2020-01-06 DIAGNOSIS — K7689 Other specified diseases of liver: Secondary | ICD-10-CM | POA: Diagnosis not present

## 2020-01-06 DIAGNOSIS — K8020 Calculus of gallbladder without cholecystitis without obstruction: Secondary | ICD-10-CM | POA: Diagnosis not present

## 2020-01-06 DIAGNOSIS — D3500 Benign neoplasm of unspecified adrenal gland: Secondary | ICD-10-CM | POA: Diagnosis not present

## 2020-01-06 DIAGNOSIS — N281 Cyst of kidney, acquired: Secondary | ICD-10-CM | POA: Diagnosis not present

## 2020-01-06 DIAGNOSIS — D4102 Neoplasm of uncertain behavior of left kidney: Secondary | ICD-10-CM

## 2020-01-06 MED ORDER — GADOBENATE DIMEGLUMINE 529 MG/ML IV SOLN
17.0000 mL | Freq: Once | INTRAVENOUS | Status: AC | PRN
  Administered 2020-01-06: 17 mL via INTRAVENOUS

## 2020-02-08 NOTE — Patient Instructions (Addendum)
DUE TO COVID-19 ONLY ONE VISITOR IS ALLOWED TO COME WITH YOU AND STAY IN THE WAITING ROOM ONLY  DURING  PRE OP AND PROCEDURE.   IF YOU WILL BE ADMITTED INTO THE HOSPITAL YOU ARE ALLOWED ONE SUPPORT PERSON DURING VISITATION  HOURS  ONLY (10AM -8PM)   . The support person may change daily. . The support person must pass our screening, gel in and out, and wear a mask at all times, including in the patient's room. . Patients must also wear a mask when staff or their support person are in the room.   COVID SWAB TESTING MUST BE COMPLETED ON:  Thursday, 02-15-20 @ 12:00 PM     [address] [place], [place] [address]  (Must self quarantine after testing. Follow instructions on handout.)        Your procedure is scheduled on:  Monday, 02-19-20   Report to [facility] Main  Entrance   Report to admitting at 9:45 AM   Call this number if you have problems the morning of surgery [phone]   Follow prep per surgeon's office:  [place] not eat food :After Midnight.   May have liquids until 8:45 AM  day of surgery   CLEAR LIQUID DIET  Foods Allowed                                                                     Foods Excluded  Water, Black Coffee and tea, regular and decaf          liquids that you cannot  Plain Jell-O in any flavor  (No red)                                 see through such as: Fruit ices (not with fruit pulp)                                      milk, soups, orange juice              Iced Popsicles (No red)                                      All solid food                                   Apple juices Sports drinks like Gatorade (No red) Lightly seasoned clear broth or consume(fat free) Sugar, honey syrup   Oral Hygiene is also important to reduce your risk of infection.                                    Remember - BRUSH YOUR TEETH THE MORNING OF SURGERY WITH  YOUR REGULAR TOOTHPASTE   Do NOT smoke after Midnight  Take these medicines the morning of surgery with A SIP OF WATER: Amlodipine                How to Manage Your Diabetes Before and After Surgery  Why is it important to control my blood sugar before and after surgery? . Improving blood sugar levels before and after surgery helps healing and can limit problems. . A way of improving blood sugar control is eating a healthy diet by: o  Eating less sugar and carbohydrates o  Increasing activity/exercise o  Talking with your doctor about reaching your blood sugar goals . High blood sugars (greater than 180 mg/dL) can raise your risk of infections and slow your recovery, so you will need to focus on controlling your diabetes during the weeks before surgery. . Make sure that the doctor who takes care of your diabetes knows about your planned surgery including the date and location.  How do I manage my blood sugar before surgery? . Check your blood sugar at least 4 times a day, starting 2 days before surgery, to make sure that the level is not too high or low. o Check your blood sugar the morning of your surgery when you wake up and every 2 hours until you get to the Short Stay unit. . If your blood sugar is less than 70 mg/dL, you will need to treat for low blood sugar: o Do not take insulin. o Treat a low blood sugar (less than 70 mg/dL) with  cup of clear juice (cranberry or apple), 4 glucose tablets, OR glucose gel. o Recheck blood sugar in 15 minutes after treatment (to make sure it is greater than 70 mg/dL). If your blood sugar is not greater than 70 mg/dL on recheck, call [phone] for further instructions. . Report your blood sugar to the short stay nurse when you get to Short Stay.  . If you are admitted to the hospital after surgery: o Your blood sugar will be checked by the staff and you will probably be given insulin after surgery (instead of oral diabetes medicines) to make  sure you have good blood sugar levels. o The goal for blood sugar control after surgery is 80-180 mg/dL.   WHAT DO I DO ABOUT MY DIABETES MEDICATION?  Marland Kitchen Do not take oral diabetes medicines (pills) the morning of surgery.  . THE DAY BEFORE SURGERY:  Take Metformin as prescribed.     Do Not Take Jardiance           . THE MORNING OF SURGERY:  Do not Take Metformin or Jardiance.   Reviewed and Endorsed by [facility] Patient Education Committee, August 2015                  You may not have any metal on your body including jewelry, and body piercings             Do not wear  lotions, powders, perfumes/cologne, or deodorant             Men may shave face and neck.   Do not bring valuables to the hospital. [place].   Contacts, dentures or bridgework may not be worn into surgery.    Patients discharged the day of surgery will not be allowed to drive home.                 Please read over the following fact sheets you were given: IF YOU  HAVE QUESTIONS ABOUT YOUR PRE OP INSTRUCTIONS  PLEASE CALL [phone]     [facility] - Preparing for Surgery Before surgery, you can play an important role.  Because skin is not sterile, your skin needs to be as free of germs as possible.  You can reduce the number of germs on your skin by washing with CHG (chlorahexidine gluconate) soap before surgery.  CHG is an antiseptic cleaner which kills germs and bonds with the skin to continue killing germs even after washing. Please DO NOT use if you have an allergy to CHG or antibacterial soaps.  If your skin becomes reddened/irritated stop using the CHG and inform your nurse when you arrive at Short Stay. Do not shave (including legs and underarms) for at least 48 hours prior to the first CHG shower.  You may shave your face/neck.  Please follow these instructions carefully:  1.  Shower with CHG Soap the night before surgery and the  morning of surgery.  2.  If you  choose to wash your hair, wash your hair first as usual with your normal  shampoo.  3.  After you shampoo, rinse your hair and body thoroughly to remove the shampoo.                             4.  Use CHG as you would any other liquid soap.  You can apply chg directly to the skin and wash.  Gently with a scrungie or clean washcloth.  5.  Apply the CHG Soap to your body ONLY FROM THE NECK DOWN.   Do   not use on face/ open                           Wound or open sores. Avoid contact with eyes, ears mouth and   genitals (private parts).                       Wash face,  Genitals (private parts) with your normal soap.             6.  Wash thoroughly, paying special attention to the area where your    surgery  will be performed.  7.  Thoroughly rinse your body with warm water from the neck down.  8.  DO NOT shower/wash with your normal soap after using and rinsing off the CHG Soap.                9.  Pat yourself dry with a clean towel.            10.  Wear clean pajamas.            11.  Place clean sheets on your bed the night of your first shower and do not  sleep with pets. Day of Surgery : Do not apply any lotions/deodorants the morning of surgery.  Please wear clean clothes to the hospital/surgery center.  FAILURE TO FOLLOW THESE INSTRUCTIONS MAY RESULT IN THE CANCELLATION OF YOUR SURGERY  PATIENT SIGNATURE_________________________________  NURSE SIGNATURE__________________________________  ________________________________________________________________________   Adam Phenix  An incentive spirometer is a tool that can help keep your lungs clear and active. This tool measures how well you are filling your lungs with each breath. Taking long deep breaths may help reverse or decrease the chance of developing breathing (pulmonary) problems (especially infection) following:  A long period of  time when you are unable to move or be active. BEFORE THE PROCEDURE   If the spirometer  includes an indicator to show your best effort, your nurse or respiratory therapist will set it to a desired goal.  If possible, sit up straight or lean slightly forward. Try not to slouch.  Hold the incentive spirometer in an upright position. INSTRUCTIONS FOR USE  1. Sit on the edge of your bed if possible, or sit up as far as you can in bed or on a chair. 2. Hold the incentive spirometer in an upright position. 3. Breathe out normally. 4. Place the mouthpiece in your mouth and seal your lips tightly around it. 5. Breathe in slowly and as deeply as possible, raising the piston or the ball toward the top of the column. 6. Hold your breath for 3-5 seconds or for as long as possible. Allow the piston or ball to fall to the bottom of the column. 7. Remove the mouthpiece from your mouth and breathe out normally. 8. Rest for a few seconds and repeat Steps 1 through 7 at least 10 times every 1-2 hours when you are awake. Take your time and take a few normal breaths between deep breaths. 9. The spirometer may include an indicator to show your best effort. Use the indicator as a goal to work toward during each repetition. 10. After each set of 10 deep breaths, practice coughing to be sure your lungs are clear. If you have an incision (the cut made at the time of surgery), support your incision when coughing by placing a pillow or rolled up towels firmly against it. Once you are able to get out of bed, walk around indoors and cough well. You may stop using the incentive spirometer when instructed by your caregiver.  RISKS AND COMPLICATIONS  Take your time so you do not get dizzy or light-headed.  If you are in pain, you may need to take or ask for pain medication before doing incentive spirometry. It is harder to take a deep breath if you are having pain. AFTER USE  Rest and breathe slowly and easily.  It can be helpful to keep track of a log of your progress. Your caregiver can provide you with a  simple table to help with this. If you are using the spirometer at home, follow these instructions: [place] IF:   You are having difficultly using the spirometer.  You have trouble using the spirometer as often as instructed.  Your pain medication is not giving enough relief while using the spirometer.  You develop fever of 100.5 F (38.1 C) or higher. SEEK IMMEDIATE MEDICAL CARE IF:   You cough up bloody sputum that had not been present before.  You develop fever of 102 F (38.9 C) or greater.  You develop worsening pain at or near the incision site. MAKE SURE YOU:   Understand these instructions.  Will watch your condition.  Will get help right away if you are not doing well or get worse. Document Released: 09/28/2006 Document Revised: 08/10/2011 Document Reviewed: 11/29/2006 Exit[employer] Patient Information 2014 [employer], [place].   ________________________________________________________________________  WHAT IS A BLOOD TRANSFUSION? Blood Transfusion Information  A transfusion is the replacement of blood or some of its parts. Blood is made up of multiple cells which provide different functions.  Red blood cells carry oxygen and are used for blood loss replacement.  White blood cells fight against infection.  Platelets control bleeding.  Plasma helps clot blood.  Other blood products are available for specialized needs, such as hemophilia or other clotting disorders. BEFORE THE TRANSFUSION  Who gives blood for transfusions?   Healthy volunteers who are fully evaluated to make sure their blood is safe. This is blood bank blood. Transfusion therapy is the safest it has ever been in the practice of medicine. Before blood is taken from a donor, a complete history is taken to make sure that person has no history of diseases nor engages in risky social behavior (examples are intravenous drug use or sexual activity with multiple partners). The donor's travel history  is screened to minimize risk of transmitting infections, such as malaria. The donated blood is tested for signs of infectious diseases, such as HIV and hepatitis. The blood is then tested to be sure it is compatible with you in order to minimize the chance of a transfusion reaction. If you or a relative donates blood, this is often done in anticipation of surgery and is not appropriate for emergency situations. It takes many days to process the donated blood. RISKS AND COMPLICATIONS Although transfusion therapy is very safe and saves many lives, the main dangers of transfusion include:   Getting an infectious disease.  Developing a transfusion reaction. This is an allergic reaction to something in the blood you were given. Every precaution is taken to prevent this. The decision to have a blood transfusion has been considered carefully by your caregiver before blood is given. Blood is not given unless the benefits outweigh the risks. AFTER THE TRANSFUSION  Right after receiving a blood transfusion, you will usually feel much better and more energetic. This is especially true if your red blood cells have gotten low (anemic). The transfusion raises the level of the red blood cells which carry oxygen, and this usually causes an energy increase.  The nurse administering the transfusion will monitor you carefully for complications. HOME CARE INSTRUCTIONS  No special instructions are needed after a transfusion. You may find your energy is better. Speak with your caregiver about any limitations on activity for underlying diseases you may have. SEEK MEDICAL CARE IF:   Your condition is not improving after your transfusion.  You develop redness or irritation at the intravenous (IV) site. SEEK IMMEDIATE MEDICAL CARE IF:  Any of the following symptoms occur over the next 12 hours:  Shaking chills.  You have a temperature by mouth above 102 F (38.9 C), not controlled by medicine.  Chest, back, or  muscle pain.  People around you feel you are not acting correctly or are confused.  Shortness of breath or difficulty breathing.  Dizziness and fainting.  You get a rash or develop hives.  You have a decrease in urine output.  Your urine turns a dark color or changes to pink, red, or brown. Any of the following symptoms occur over the next 10 days:  You have a temperature by mouth above 102 F (38.9 C), not controlled by medicine.  Shortness of breath.  Weakness after normal activity.  The white part of the eye turns yellow (jaundice).  You have a decrease in the amount of urine or are urinating less often.  Your urine turns a dark color or changes to pink, red, or brown. Document Released: 05/15/2000 Document Revised: 08/10/2011 Document Reviewed: 01/02/2008 [facility] Patient Information 2014 [employer], [place].  _______________________________________________________________________

## 2020-02-08 NOTE — Progress Notes (Addendum)
COVID Vaccine Completed:  No Date COVID Vaccine completed: COVID vaccine manufacturer: [place]   PCP - Monico Blitz, MD Cardiologist - Rozann Lesches, MD  Cardiac Clearance on chart from Coletta Memos, NP-C dated 12-21-19  Chest x-ray -  EKG - 12-20-19 in Epic Stress Test -  ECHO - 03-15-17 in Epic Cardiac Cath -   Sleep Study - CPAP -   Fasting Blood Sugar - 137-150 Checks Blood Sugar - one time a day  Blood Thinner Instructions: Aspirin Instructions:  ASA 81 mg.  Pt to check with Dr. Alinda Money on 02-13-20 Last Dose:  Anesthesia review:   Patient denies shortness of breath, fever, cough and chest pain at PAT appointment   Patient verbalized understanding of instructions that were given to them at the PAT appointment. Patient was also instructed that they will need to review over the PAT instructions again at home before surgery.

## 2020-02-12 ENCOUNTER — Other Ambulatory Visit: Payer: Self-pay

## 2020-02-12 ENCOUNTER — Encounter (HOSPITAL_COMMUNITY): Payer: Self-pay

## 2020-02-12 ENCOUNTER — Encounter (HOSPITAL_COMMUNITY)
Admission: RE | Admit: 2020-02-12 | Discharge: 2020-02-12 | Disposition: A | Discharge status: Home / Self Care | Payer: Medicare Other | Source: Ambulatory Visit | Attending: Urology | Admitting: Urology

## 2020-02-12 DIAGNOSIS — Z01812 Encounter for preprocedural laboratory examination: Secondary | ICD-10-CM | POA: Diagnosis not present

## 2020-02-12 LAB — CBC
HCT: 49.5 % (ref 39.0–52.0)
Hemoglobin: 15.9 g/dL (ref 13.0–17.0)
MCH: 30.6 pg (ref 26.0–34.0)
MCHC: 32.1 g/dL (ref 30.0–36.0)
MCV: 95.2 fL (ref 80.0–100.0)
Platelets: 133 10*3/uL — ABNORMAL LOW (ref 150–400)
RBC: 5.2 MIL/uL (ref 4.22–5.81)
RDW: 13.6 % (ref 11.5–15.5)
WBC: 9.9 10*3/uL (ref 4.0–10.5)
nRBC: 0 % (ref 0.0–0.2)

## 2020-02-12 LAB — BASIC METABOLIC PANEL
Anion gap: 10 (ref 5–15)
BUN: 14 mg/dL (ref 6–20)
CO2: 26 mmol/L (ref 22–32)
Calcium: 10.1 mg/dL (ref 8.9–10.3)
Chloride: 105 mmol/L (ref 98–111)
Creatinine, Ser: 0.83 mg/dL (ref 0.61–1.24)
GFR calc Af Amer: >60 mL/min (ref >60)
GFR calc non Af Amer: >60 mL/min (ref >60)
Glucose, Bld: 186 mg/dL — ABNORMAL HIGH (ref 70–99)
Potassium: 5.3 mmol/L — ABNORMAL HIGH (ref 3.5–5.1)
Sodium: 141 mmol/L (ref 135–145)

## 2020-02-12 LAB — HEMOGLOBIN A1C
Hgb A1c MFr Bld: 8.4 % — ABNORMAL HIGH (ref 4.8–5.6)
Mean Plasma Glucose: 194.38 mg/dL

## 2020-02-12 LAB — GLUCOSE, CAPILLARY: Glucose-Capillary: 225 mg/dL — ABNORMAL HIGH (ref 70–99)

## 2020-02-12 NOTE — Progress Notes (Signed)
A1c sent to Dr. Borden to review 

## 2020-02-13 DIAGNOSIS — D4102 Neoplasm of uncertain behavior of left kidney: Secondary | ICD-10-CM | POA: Diagnosis not present

## 2020-02-13 DIAGNOSIS — Z8551 Personal history of malignant neoplasm of bladder: Secondary | ICD-10-CM | POA: Diagnosis not present

## 2020-02-15 ENCOUNTER — Other Ambulatory Visit (HOSPITAL_COMMUNITY)
Admission: RE | Admit: 2020-02-15 | Discharge: 2020-02-15 | Disposition: A | Discharge status: Home / Self Care | Payer: Medicare Other | Source: Ambulatory Visit | Attending: Urology | Admitting: Urology

## 2020-02-15 DIAGNOSIS — Z20822 Contact with and (suspected) exposure to covid-19: Secondary | ICD-10-CM | POA: Diagnosis not present

## 2020-02-15 LAB — SARS CORONAVIRUS 2 (TAT 6-24 HRS): SARS Coronavirus 2: NEGATIVE

## 2020-02-16 NOTE — H&P (Signed)
Office Visit Report     02/13/2020   --------------------------------------------------------------------------------   Eugene Harding  MRN: [id number]  DOB: [birth year], 59 year old Male  SSN:    PRIMARY CARE:  Ashish C. Manuella Ghazi, MD  REFERRING:  Georgette Dover, MD  PROVIDER:  Festus Aloe, M.D.  TREATING:  Raynelle Bring, M.D.  LOCATION:  [facility] [phone]     --------------------------------------------------------------------------------   CC/HPI: CC: Prostate Cancer and Bladder Cancer   Physician requesting consult: Dr. Eda Keys  PCP: Dr. Monico Blitz   [name] is a 59 year old gentleman who presented to Dr. Junious Silk in May 2021 with painless hematuria and an elevated PSA of 4.28. A CT of the abdomen and pelvis with contrast only had already been performed in 07/30/19 and indicated an indeterminate 1.6 cm interpolar left renal lesion and a 1.5 cm left sided bladder mass. Renal ultrasound remained equivocal regarding the left renal mass. Office cystoscopy confirmed a 1.5 cm left bladder tumor. He underwent a TURBT and TRUS biopsy of the prostate on 11/03/19. Pathology indicated a low grade, Ta urothelial carcinoma of the bladder. No muscularis propria was noted in the specimen. Postoperative intravesical gemcitabine was administered. His prostate biopsy demonstrated Gleason 3+4=7 adenocarcinoma with 9 out of 12 biopsy cores positive for malignancy.   He follows up today in preparation for his upcoming radical prostatectomy surgery to undergo surveillance cystoscopy for bladder cancer before his prostatectomy. He has been seen by Dr. Domenic Polite with [facility] who feels that he is at an acceptable risk to proceed with his radical prostatectomy. He remains on aspirin 81 mg but has been off Plavix.   Family history: None.   Imaging studies:  CT abd/pelvis (Feb 2021): See above. No pelvic LAD or metastases.   PMH: He has a history of CVA x 2  (last 3 years ago and managed with ASA), diabetes, hypertension, gout, hyperlipidemia.  PSH: No abdominal surgeries.   TNM stage: cT1c N0 Mx  PSA: 4.28  Gleason score: 3+4=7 (GG 2)  Biopsy (11/03/19): 9/12 cores positive  Left: L lateral apex (70%, 3+4=7, PNI), L apex (30%, 3+3=6), L lateral mid (20%, 3+4=7, PNI), L mid (40%, 3+3=6), L lateral base (10%, 3+4=7), L base (5%, 3+3=6)  Right: R apex (10%, 3+3=6), R lateral mid (70%, 3+4=7), R lateral base (50%, 3+4=7)  Prostate volume: 37.1 cc   Nomogram  OC disease: 52%  EPE: 46%  SVI: 7%  LNI: 6%  PFS (5 year, 10 year): 84%, 73%   Urinary function: IPSS is 13.  Erectile function: SHIM score is 19. He estimates that he can get an erection only 30% of the time.     ALLERGIES: No Allergies    MEDICATIONS: Lisinopril  Metformin Hcl  Plavix 75 mg tablet 1 tablet PO Daily  Amlodipine Besilate  Aspirin Ec 81 mg tablet, delayed release  Atorvastatin Calcium  Jardiance 25 mg tablet 1 tablet PO Daily     GU PSH: Bladder Instill AntiCA Agent - 11/03/2019 Cystoscopy - 10/17/2019 Cystoscopy TURBT 2-5 cm - 11/03/2019 Prostate Needle Biopsy - 11/03/2019     NON-GU PSH: No Non-GU PSH    GU PMH: History of bladder cancer - 12/12/2019 Bladder Cancer Trigone , Discussed close surveillance needed - recurrence and progression. - 11/23/2019 Prostate Cancer, I had a long discussion with the patient using the understanding prostate cancer booklet and his path report as a reference. We discussed his stage, grade and prognosis. We  discussed the nature risks and benefits of active surveillance, radical prostatectomy, external beam radiotherapy, and brachytherapy. We discussed the role of androgen deprivation and chemotherapy in prostate cancer. We also discussed other ablative techniques such as HiFU and cryotherapy as well as whole gland versus focal treatment. We discussed specifically how each treatment might affect bowel, bladder and sexual function. We  discussed how each treatment might effect salvage treatments and active surveillance might lead to progression and more difficult treatment in the future. All questions answered. His brother is here with him. They know folks that have had surgery and XRT. He is most interested in XRT. I will refer to Dr. Alinda Money. - 11/23/2019 Bladder, Neoplasm of Unspecified behavior, Discussed cysto finding and nature r/b/a to TURBT and post-op instillation gemcitabine possible left ureteral stent, possible foley. Bx prostate as above. - 10/17/2019 Elevated PSA, Discussed PSA and we will set up for biopsy in OR. - 10/17/2019, discussed nature of PSA elevation and PSa sent , - 08/23/2019 Gross hematuria - 10/17/2019, UA clear. Needs cystoscopy - dscussed with patient . PSA sent and if he needs something done in the OR we can add on TRUS prostate bx , - 08/23/2019 Bladder tumor/neoplasm, may be a bladder tumor vs left ureterocele - 07/01/8655 Left uncertain neoplasm of kidney, check renal US for cyst - I drew him a picture of the anatomy - kidney, bladder and prostate - 08/23/2019    NON-GU PMH: Muscle weakness (generalized) - 01/05/2020 Bacteriuria - 12/12/2019 Diabetes Type 2 Gout Hypertension Stroke/TIA    FAMILY HISTORY: father deceased - Other mother deceased - Other    Notes: mother died at age 15 from aneurism, father died at age 65 from a blood clot    SOCIAL HISTORY: Marital Status: Single Current Smoking Status: Patient does not smoke anymore. Has not smoked since 07/30/2016. Smoked for 15 years. Smoked 1 pack per day.   Tobacco Use Assessment Completed: Used Tobacco in last 30 days? Has never drank.  Drinks 2 caffeinated drinks per day.    REVIEW OF SYSTEMS:    GU Review Male:   Patient denies frequent urination, hard to postpone urination, burning/ pain with urination, get up at night to urinate, leakage of urine, stream starts and stops, trouble starting your streams, and have to strain to urinate .   Gastrointestinal (Lower):   Patient denies diarrhea and constipation.  Gastrointestinal (Upper):   Patient denies nausea and vomiting.  Constitutional:   Patient denies fever, night sweats, weight loss, and fatigue.  Skin:   Patient denies skin rash/ lesion and itching.  Eyes:   Patient denies blurred vision and double vision.  Ears/ Nose/ Throat:   Patient denies sore throat and sinus problems.  Hematologic/Lymphatic:   Patient denies swollen glands and easy bruising.  Cardiovascular:   Patient denies leg swelling and chest pains.  Respiratory:   Patient denies shortness of breath and cough.  Endocrine:   Patient denies excessive thirst.  Musculoskeletal:   Patient denies back pain and joint pain.  Neurological:   Patient denies headaches and dizziness.  Psychologic:   Patient denies depression and anxiety.   VITAL SIGNS:      02/13/2020 04:00 PM  Weight 183 lb / 83.01 kg  Height 64 in / 162.56 cm  BP 125/65 mmHg  Pulse 104 /min  Temperature 97.7 F / 36.5 C  BMI 31.4 kg/m   GU PHYSICAL EXAMINATION:    Urethral Meatus: Normal size. No lesion, no wart, no discharge, no  polyp. Normal location.   MULTI-SYSTEM PHYSICAL EXAMINATION:    Constitutional: Well-nourished. No physical deformities. Normally developed. Good grooming.  Neck: Neck symmetrical, not swollen. Normal tracheal position.  Respiratory: No labored breathing, no use of accessory muscles. Clear bilaterally.  Cardiovascular: Normal temperature, normal extremity pulses, no swelling, no varicosities. Regular rate and rhythm.  Lymphatic: No enlargement of neck, axillae, groin.  Skin: No paleness, no jaundice, no cyanosis. No lesion, no ulcer, no rash.  Neurologic / Psychiatric: Oriented to time, oriented to place, oriented to person. No depression, no anxiety, no agitation.  Gastrointestinal: No mass, no tenderness, no rigidity, non obese abdomen.  Eyes: Normal conjunctivae. Normal eyelids.  Ears, Nose, Mouth, and Throat:  Left ear no scars, no lesions, no masses. Right ear no scars, no lesions, no masses. Nose no scars, no lesions, no masses. Normal hearing. Normal lips.  Musculoskeletal: Normal gait and station of head and neck.     Complexity of Data:  Records Review:   Previous Patient Records  X-Ray Review: MRI Abdomen: Reviewed Films.     08/24/19  PSA  Total PSA 4.28 ng/mL  Free PSA 0.61 ng/mL  % Free PSA 14 % PSA    PROCEDURES:         Flexible Cystoscopy - 52000  Indication: Bladder cancer Risks, benefits, and potential complications of the procedure were discussed with the patient including infection, bleeding, voiding discomfort, urinary retention, fever, chills, sepsis, and others. All questions were answered. Informed consent was obtained. Sterile technique and intraurethral analgesia were used.  Meatus:  Normal size. Normal location. Normal condition.  Urethra:  No strictures.  External Sphincter:  Normal.  Verumontanum:  Normal.  Prostate:  Non-obstructing. No hyperplasia.  Bladder Neck:  Non-obstructing.  Ureteral Orifices:  Normal location. Normal size. Normal shape. Effluxed clear urine.  Bladder:  No trabeculation. No tumors. Normal mucosa. No stones.      Chaperone: Alleen Borne The procedure was well-tolerated and without complications. Instructions were given to call the office immediately if questions or problems.         Urinalysis Dipstick Dipstick Cont'd  Color: Yellow Bilirubin: Neg mg/dL  Appearance: Clear Ketones: Neg mg/dL  Specific Gravity: 1.020 Blood: Neg ery/uL  pH: <=5.0 Protein: Neg mg/dL  Glucose: 3+ mg/dL Urobilinogen: 0.2 mg/dL    Nitrites: Neg    Leukocyte Esterase: Neg leu/uL    ASSESSMENT:      ICD-10 Details  1 GU:   History of bladder cancer - Z85.51   2   Prostate Cancer - H20   3   Left uncertain neoplasm of kidney - D41.02    PLAN:           Schedule Return Visit/Planned Activity: Other See Visit Notes             Note: Please schedule PT  follow up for about one month after his surgery for next week.  Return Visit/Planned Activity: Other See Visit Notes             Note: Cancel October appt with [place].          Document Letter(s):  Created for Patient: Clinical Summary         Notes:   1. Prostate cancer: He is scheduled to proceed with his radical prostatectomy on Monday and will plan to undergo a bilateral nerve-sparing robot assisted laparoscopic radical prostatectomy and pelvic lymphadenectomy. He is felt to be at an acceptable cardiac risk based on his evaluation by Dr. Domenic Polite. He  will continue his aspirin 81 mg perioperatively but knows to stay off Plavix. We have reviewed the expected recovery process as well as the potential risks of surgery including long-term risks and goals that included cancer control, urinary function, and erectile function. All questions were answered to his stated satisfaction and he does wish to proceed as planned.   2. Bladder cancer: No evidence of cystoscopic recurrence. He will be due for cystoscopic surveillance next June. I will plan to schedule this follow-up with Dr. Junious Silk. In the meantime, his appointment for October will be canceled.   3. Left renal neoplasm: We discussed his recent MRI that demonstrated a Bosniak IIF lesion. This will require follow-up imaging in this likely can be arranged prior to his next appointment with Dr. Junious Silk next summer in addition to his planned cystoscopic surveillance.   Cc: Dr. Festus Aloe  Dr. Monico Blitz  Dr. Rozann Lesches        Next Appointment:      Next Appointment: 02/13/2020 04:00 PM    Appointment Type: Office Visit Established Patient    Location: [facility] [phone]    Provider: Raynelle Bring, M.D.    Reason for Visit: PRE-OP WITH CYSTO PER DR Alinda Money      E & M CODES: We spent 43 minutes dedicated to evaluation and management time, including face to face interaction, discussions on coordination of  care, documentation, result review, and discussion with others as applicable.     * Signed by Raynelle Bring, M.D. on 02/13/20 at 9:16 PM (EDT*

## 2020-02-19 ENCOUNTER — Encounter (HOSPITAL_COMMUNITY)
Admission: RE | Disposition: A | Discharge status: Home / Self Care | Payer: Self-pay | Source: Home / Self Care | Attending: Urology

## 2020-02-19 ENCOUNTER — Encounter (HOSPITAL_COMMUNITY): Payer: Self-pay | Admitting: Urology

## 2020-02-19 ENCOUNTER — Observation Stay (HOSPITAL_COMMUNITY)
Admission: RE | Admit: 2020-02-19 | Discharge: 2020-02-20 | Disposition: A | Discharge status: Home / Self Care | Payer: Medicare Other | Attending: Urology | Admitting: Urology

## 2020-02-19 ENCOUNTER — Other Ambulatory Visit: Payer: Self-pay

## 2020-02-19 ENCOUNTER — Ambulatory Visit (HOSPITAL_COMMUNITY): Payer: Medicare Other | Admitting: Anesthesiology

## 2020-02-19 ENCOUNTER — Ambulatory Visit (HOSPITAL_COMMUNITY): Payer: Medicare Other | Admitting: Physician Assistant

## 2020-02-19 DIAGNOSIS — I10 Essential (primary) hypertension: Secondary | ICD-10-CM | POA: Insufficient documentation

## 2020-02-19 DIAGNOSIS — Z8673 Personal history of transient ischemic attack (TIA), and cerebral infarction without residual deficits: Secondary | ICD-10-CM | POA: Diagnosis not present

## 2020-02-19 DIAGNOSIS — Z7901 Long term (current) use of anticoagulants: Secondary | ICD-10-CM | POA: Insufficient documentation

## 2020-02-19 DIAGNOSIS — D4102 Neoplasm of uncertain behavior of left kidney: Secondary | ICD-10-CM | POA: Diagnosis not present

## 2020-02-19 DIAGNOSIS — C61 Malignant neoplasm of prostate: Secondary | ICD-10-CM | POA: Diagnosis not present

## 2020-02-19 DIAGNOSIS — C775 Secondary and unspecified malignant neoplasm of intrapelvic lymph nodes: Secondary | ICD-10-CM | POA: Diagnosis not present

## 2020-02-19 DIAGNOSIS — Z8551 Personal history of malignant neoplasm of bladder: Secondary | ICD-10-CM | POA: Insufficient documentation

## 2020-02-19 DIAGNOSIS — Z7982 Long term (current) use of aspirin: Secondary | ICD-10-CM | POA: Insufficient documentation

## 2020-02-19 DIAGNOSIS — Z87891 Personal history of nicotine dependence: Secondary | ICD-10-CM | POA: Insufficient documentation

## 2020-02-19 DIAGNOSIS — R319 Hematuria, unspecified: Secondary | ICD-10-CM | POA: Diagnosis present

## 2020-02-19 DIAGNOSIS — D696 Thrombocytopenia, unspecified: Secondary | ICD-10-CM | POA: Diagnosis not present

## 2020-02-19 DIAGNOSIS — Z79899 Other long term (current) drug therapy: Secondary | ICD-10-CM | POA: Diagnosis not present

## 2020-02-19 DIAGNOSIS — E119 Type 2 diabetes mellitus without complications: Secondary | ICD-10-CM | POA: Insufficient documentation

## 2020-02-19 DIAGNOSIS — E785 Hyperlipidemia, unspecified: Secondary | ICD-10-CM | POA: Insufficient documentation

## 2020-02-19 DIAGNOSIS — Z794 Long term (current) use of insulin: Secondary | ICD-10-CM | POA: Diagnosis not present

## 2020-02-19 DIAGNOSIS — M109 Gout, unspecified: Secondary | ICD-10-CM | POA: Diagnosis not present

## 2020-02-19 HISTORY — PX: LYMPHADENECTOMY: SHX5960

## 2020-02-19 HISTORY — PX: ROBOT ASSISTED LAPAROSCOPIC RADICAL PROSTATECTOMY: SHX5141

## 2020-02-19 LAB — TYPE AND SCREEN
ABO/RH(D): O POS
Antibody Screen: NEGATIVE

## 2020-02-19 LAB — ABO/RH: ABO/RH(D): O POS

## 2020-02-19 LAB — GLUCOSE, CAPILLARY
Glucose-Capillary: 153 mg/dL — ABNORMAL HIGH (ref 70–99)
Glucose-Capillary: 188 mg/dL — ABNORMAL HIGH (ref 70–99)
Glucose-Capillary: 183 mg/dL — ABNORMAL HIGH (ref 70–99)

## 2020-02-19 LAB — HEMOGLOBIN AND HEMATOCRIT, BLOOD
HCT: 44.5 % (ref 39.0–52.0)
Hemoglobin: 14.2 g/dL (ref 13.0–17.0)

## 2020-02-19 LAB — POCT I-STAT, CHEM 8
BUN: 18 mg/dL (ref 6–20)
Calcium, Ion: 1.17 mmol/L (ref 1.15–1.40)
Chloride: 107 mmol/L (ref 98–111)
Creatinine, Ser: 0.8 mg/dL (ref 0.61–1.24)
Glucose, Bld: 138 mg/dL — ABNORMAL HIGH (ref 70–99)
HCT: 45 % (ref 39.0–52.0)
Hemoglobin: 15.3 g/dL (ref 13.0–17.0)
Potassium: 4.3 mmol/L (ref 3.5–5.1)
Sodium: 138 mmol/L (ref 135–145)
TCO2: 23 mmol/L (ref 22–32)

## 2020-02-19 SURGERY — XI ROBOTIC ASSISTED LAPAROSCOPIC RADICAL PROSTATECTOMY LEVEL 2
Anesthesia: General

## 2020-02-19 MED ORDER — FENTANYL CITRATE (PF) 250 MCG/5ML IJ SOLN
INTRAMUSCULAR | Status: AC
  Filled 2020-02-19: qty 5

## 2020-02-19 MED ORDER — FENTANYL CITRATE (PF) 100 MCG/2ML IJ SOLN: 25.0000 ug | INTRAMUSCULAR | Status: DC | PRN

## 2020-02-19 MED ORDER — ONDANSETRON HCL 4 MG/2ML IJ SOLN
INTRAMUSCULAR | Status: DC | PRN
  Administered 2020-02-19: 4 mg via INTRAVENOUS

## 2020-02-19 MED ORDER — HEPARIN SODIUM (PORCINE) 1000 UNIT/ML IJ SOLN
INTRAMUSCULAR | Status: AC
  Filled 2020-02-19: qty 1

## 2020-02-19 MED ORDER — AMLODIPINE BESYLATE 5 MG PO TABS
5.0000 mg | ORAL_TABLET | Freq: Every day | ORAL | Status: DC
  Administered 2020-02-20: 5 mg via ORAL
  Filled 2020-02-19: qty 1

## 2020-02-19 MED ORDER — CHLORHEXIDINE GLUCONATE 0.12 % MT SOLN: 15.0000 mL | Freq: Once | OROMUCOSAL | Status: DC

## 2020-02-19 MED ORDER — PHENYLEPHRINE 40 MCG/ML (10ML) SYRINGE FOR IV PUSH (FOR BLOOD PRESSURE SUPPORT)
PREFILLED_SYRINGE | INTRAVENOUS | Status: DC | PRN
  Administered 2020-02-19: 80 ug via INTRAVENOUS

## 2020-02-19 MED ORDER — CEFAZOLIN SODIUM-DEXTROSE 1-4 GM/50ML-% IV SOLN
1.0000 g | Freq: Three times a day (TID) | INTRAVENOUS | Status: AC
  Administered 2020-02-19 – 2020-02-20 (×2): 1 g via INTRAVENOUS
  Filled 2020-02-19 (×2): qty 50

## 2020-02-19 MED ORDER — OXYCODONE HCL 5 MG/5ML PO SOLN: 5.0000 mg | Freq: Once | ORAL | Status: DC | PRN

## 2020-02-19 MED ORDER — LISINOPRIL 20 MG PO TABS
40.0000 mg | ORAL_TABLET | Freq: Every day | ORAL | Status: DC
  Administered 2020-02-20: 40 mg via ORAL
  Filled 2020-02-19: qty 2

## 2020-02-19 MED ORDER — FENTANYL CITRATE (PF) 100 MCG/2ML IJ SOLN
INTRAMUSCULAR | Status: DC | PRN
Start: 2020-02-19 — End: 2020-02-19
  Administered 2020-02-19 (×3): 50 ug via INTRAVENOUS
  Administered 2020-02-19: 100 ug via INTRAVENOUS
  Administered 2020-02-19 (×2): 50 ug via INTRAVENOUS

## 2020-02-19 MED ORDER — OXYCODONE HCL 5 MG PO TABS: 5.0000 mg | ORAL_TABLET | Freq: Once | ORAL | Status: DC | PRN

## 2020-02-19 MED ORDER — ZOLPIDEM TARTRATE 5 MG PO TABS: 5.0000 mg | ORAL_TABLET | Freq: Every evening | ORAL | Status: DC | PRN

## 2020-02-19 MED ORDER — MIDAZOLAM HCL 2 MG/2ML IJ SOLN
INTRAMUSCULAR | Status: AC
  Filled 2020-02-19: qty 2

## 2020-02-19 MED ORDER — POTASSIUM CHLORIDE IN NACL 20-0.45 MEQ/L-% IV SOLN
INTRAVENOUS | Status: DC
  Filled 2020-02-19 (×2): qty 1000

## 2020-02-19 MED ORDER — SULFAMETHOXAZOLE-TRIMETHOPRIM 800-160 MG PO TABS: 1.0000 | ORAL_TABLET | Freq: Two times a day (BID) | ORAL | 0 refills | Status: DC

## 2020-02-19 MED ORDER — PROPOFOL 10 MG/ML IV BOLUS
INTRAVENOUS | Status: DC | PRN
  Administered 2020-02-19: 180 mg via INTRAVENOUS

## 2020-02-19 MED ORDER — PROMETHAZINE HCL 25 MG/ML IJ SOLN: 6.2500 mg | INTRAMUSCULAR | Status: DC | PRN

## 2020-02-19 MED ORDER — LACTATED RINGERS IV SOLN: INTRAVENOUS | Status: DC

## 2020-02-19 MED ORDER — STERILE WATER FOR IRRIGATION IR SOLN
Status: DC | PRN
  Administered 2020-02-19: 1000 mL

## 2020-02-19 MED ORDER — ORAL CARE MOUTH RINSE: 15.0000 mL | Freq: Once | OROMUCOSAL | Status: DC

## 2020-02-19 MED ORDER — LIDOCAINE 2% (20 MG/ML) 5 ML SYRINGE
INTRAMUSCULAR | Status: DC | PRN
  Administered 2020-02-19: 80 mg via INTRAVENOUS

## 2020-02-19 MED ORDER — TRAMADOL HCL 50 MG PO TABS: 50.0000 mg | ORAL_TABLET | Freq: Four times a day (QID) | ORAL | 0 refills | Status: DC | PRN

## 2020-02-19 MED ORDER — SUCCINYLCHOLINE CHLORIDE 20 MG/ML IJ SOLN
INTRAMUSCULAR | Status: DC | PRN
  Administered 2020-02-19: 120 mg via INTRAVENOUS

## 2020-02-19 MED ORDER — MIDAZOLAM HCL 5 MG/5ML IJ SOLN
INTRAMUSCULAR | Status: DC | PRN
  Administered 2020-02-19: 2 mg via INTRAVENOUS

## 2020-02-19 MED ORDER — ONDANSETRON HCL 4 MG/2ML IJ SOLN
INTRAMUSCULAR | Status: AC
  Filled 2020-02-19: qty 2

## 2020-02-19 MED ORDER — KETOROLAC TROMETHAMINE 15 MG/ML IJ SOLN
15.0000 mg | Freq: Four times a day (QID) | INTRAMUSCULAR | Status: DC
  Administered 2020-02-19 – 2020-02-20 (×3): 15 mg via INTRAVENOUS
  Filled 2020-02-19 (×3): qty 1

## 2020-02-19 MED ORDER — ONDANSETRON HCL 4 MG/2ML IJ SOLN: 4.0000 mg | INTRAMUSCULAR | Status: DC | PRN

## 2020-02-19 MED ORDER — SODIUM CHLORIDE 0.9 % IR SOLN
Status: DC | PRN
  Administered 2020-02-19: 1000 mL via INTRAVESICAL

## 2020-02-19 MED ORDER — FENTANYL CITRATE (PF) 100 MCG/2ML IJ SOLN
INTRAMUSCULAR | Status: AC
  Filled 2020-02-19: qty 2

## 2020-02-19 MED ORDER — BUPIVACAINE-EPINEPHRINE (PF) 0.25% -1:200000 IJ SOLN
INTRAMUSCULAR | Status: DC | PRN
  Administered 2020-02-19: 30 mL

## 2020-02-19 MED ORDER — PHENYLEPHRINE 40 MCG/ML (10ML) SYRINGE FOR IV PUSH (FOR BLOOD PRESSURE SUPPORT)
PREFILLED_SYRINGE | INTRAVENOUS | Status: AC
  Filled 2020-02-19: qty 10

## 2020-02-19 MED ORDER — ASPIRIN 81 MG PO CHEW
81.0000 mg | CHEWABLE_TABLET | Freq: Every day | ORAL | Status: DC
  Administered 2020-02-20: 81 mg via ORAL
  Filled 2020-02-19: qty 1

## 2020-02-19 MED ORDER — DIPHENHYDRAMINE HCL 12.5 MG/5ML PO ELIX: 12.5000 mg | ORAL_SOLUTION | Freq: Four times a day (QID) | ORAL | Status: DC | PRN

## 2020-02-19 MED ORDER — DEXAMETHASONE SODIUM PHOSPHATE 10 MG/ML IJ SOLN
INTRAMUSCULAR | Status: DC | PRN
  Administered 2020-02-19: 10 mg via INTRAVENOUS

## 2020-02-19 MED ORDER — ACETAMINOPHEN 325 MG PO TABS: 650.0000 mg | ORAL_TABLET | ORAL | Status: DC | PRN

## 2020-02-19 MED ORDER — ROCURONIUM BROMIDE 10 MG/ML (PF) SYRINGE
PREFILLED_SYRINGE | INTRAVENOUS | Status: DC | PRN
  Administered 2020-02-19: 70 mg via INTRAVENOUS
  Administered 2020-02-19: 10 mg via INTRAVENOUS
  Administered 2020-02-19: 20 mg via INTRAVENOUS

## 2020-02-19 MED ORDER — PROPOFOL 10 MG/ML IV BOLUS
INTRAVENOUS | Status: AC
  Filled 2020-02-19: qty 20

## 2020-02-19 MED ORDER — LACTATED RINGERS IV SOLN: INTRAVENOUS | Status: DC | PRN

## 2020-02-19 MED ORDER — INSULIN ASPART 100 UNIT/ML ~~LOC~~ SOLN
0.0000 [IU] | SUBCUTANEOUS | Status: DC
  Administered 2020-02-19 – 2020-02-20 (×4): 3 [IU] via SUBCUTANEOUS

## 2020-02-19 MED ORDER — DOCUSATE SODIUM 100 MG PO CAPS
100.0000 mg | ORAL_CAPSULE | Freq: Two times a day (BID) | ORAL | Status: DC
  Administered 2020-02-19 – 2020-02-20 (×2): 100 mg via ORAL
  Filled 2020-02-19 (×2): qty 1

## 2020-02-19 MED ORDER — SUGAMMADEX SODIUM 200 MG/2ML IV SOLN
INTRAVENOUS | Status: DC | PRN
  Administered 2020-02-19: 200 mg via INTRAVENOUS

## 2020-02-19 MED ORDER — LACTATED RINGERS IV SOLN
INTRAVENOUS | Status: DC | PRN
  Administered 2020-02-19: 1000 mL

## 2020-02-19 MED ORDER — ATORVASTATIN CALCIUM 40 MG PO TABS: 40.0000 mg | ORAL_TABLET | Freq: Every day | ORAL | Status: DC

## 2020-02-19 MED ORDER — CEFAZOLIN SODIUM-DEXTROSE 2-4 GM/100ML-% IV SOLN
2.0000 g | Freq: Once | INTRAVENOUS | Status: AC
  Administered 2020-02-19: 2 g via INTRAVENOUS
  Filled 2020-02-19: qty 100

## 2020-02-19 MED ORDER — DIPHENHYDRAMINE HCL 50 MG/ML IJ SOLN: 12.5000 mg | Freq: Four times a day (QID) | INTRAMUSCULAR | Status: DC | PRN

## 2020-02-19 MED ORDER — BACITRACIN-NEOMYCIN-POLYMYXIN 400-5-5000 EX OINT: 1.0000 "application " | TOPICAL_OINTMENT | Freq: Three times a day (TID) | CUTANEOUS | Status: DC | PRN

## 2020-02-19 MED ORDER — BUPIVACAINE-EPINEPHRINE (PF) 0.25% -1:200000 IJ SOLN
INTRAMUSCULAR | Status: AC
  Filled 2020-02-19: qty 30

## 2020-02-19 MED ORDER — SODIUM CHLORIDE 0.9 % IV BOLUS
1000.0000 mL | Freq: Once | INTRAVENOUS | Status: AC
  Administered 2020-02-19: 1000 mL via INTRAVENOUS

## 2020-02-19 MED ORDER — MORPHINE SULFATE (PF) 2 MG/ML IV SOLN
2.0000 mg | INTRAVENOUS | Status: DC | PRN
  Administered 2020-02-19: 2 mg via INTRAVENOUS
  Filled 2020-02-19: qty 1

## 2020-02-19 MED ORDER — BELLADONNA ALKALOIDS-OPIUM 16.2-60 MG RE SUPP: 1.0000 | Freq: Four times a day (QID) | RECTAL | Status: DC | PRN

## 2020-02-19 SURGICAL SUPPLY — 59 items
APPLICATOR COTTON TIP 6 STRL (MISCELLANEOUS) ×2 IMPLANT
APPLICATOR COTTON TIP 6IN STRL (MISCELLANEOUS) ×4
CATH FOLEY 2WAY SLVR 18FR 30CC (CATHETERS) ×4 IMPLANT
CATH ROBINSON RED A/P 16FR (CATHETERS) ×4 IMPLANT
CATH ROBINSON RED A/P 8FR (CATHETERS) ×4 IMPLANT
CATH TIEMANN FOLEY 18FR 5CC (CATHETERS) ×4 IMPLANT
CHLORAPREP W/TINT 26 (MISCELLANEOUS) ×4 IMPLANT
CLIP VESOLOCK LG 6/CT PURPLE (CLIP) ×8 IMPLANT
COVER SURGICAL LIGHT HANDLE (MISCELLANEOUS) ×4 IMPLANT
COVER TIP SHEARS 8 DVNC (MISCELLANEOUS) ×2 IMPLANT
COVER TIP SHEARS 8MM DA VINCI (MISCELLANEOUS) ×2
COVER WAND RF STERILE (DRAPES) IMPLANT
CUTTER ECHEON FLEX ENDO 45 340 (ENDOMECHANICALS) ×4 IMPLANT
DECANTER SPIKE VIAL GLASS SM (MISCELLANEOUS) ×4 IMPLANT
DERMABOND ADVANCED (GAUZE/BANDAGES/DRESSINGS) ×2
DERMABOND ADVANCED .7 DNX12 (GAUZE/BANDAGES/DRESSINGS) ×2 IMPLANT
DRAIN CHANNEL RND F F (WOUND CARE) ×4 IMPLANT
DRAPE ARM DVNC X/XI (DISPOSABLE) ×8 IMPLANT
DRAPE COLUMN DVNC XI (DISPOSABLE) ×2 IMPLANT
DRAPE DA VINCI XI ARM (DISPOSABLE) ×8
DRAPE DA VINCI XI COLUMN (DISPOSABLE) ×2
DRAPE SURG IRRIG POUCH 19X23 (DRAPES) ×4 IMPLANT
DRSG TEGADERM 4X4.75 (GAUZE/BANDAGES/DRESSINGS) ×4 IMPLANT
ELECT REM PT RETURN 15FT ADLT (MISCELLANEOUS) ×4 IMPLANT
GLOVE BIO SURGEON STRL SZ 6.5 (GLOVE) ×3 IMPLANT
GLOVE BIO SURGEONS STRL SZ 6.5 (GLOVE) ×1
GLOVE BIOGEL M STRL SZ7.5 (GLOVE) ×8 IMPLANT
GOWN STRL REUS W/TWL LRG LVL3 (GOWN DISPOSABLE) ×12 IMPLANT
HOLDER FOLEY CATH W/STRAP (MISCELLANEOUS) ×4 IMPLANT
IRRIG SUCT STRYKERFLOW 2 WTIP (MISCELLANEOUS) ×4
IRRIGATION SUCT STRKRFLW 2 WTP (MISCELLANEOUS) ×2 IMPLANT
IV LACTATED RINGERS 1000ML (IV SOLUTION) ×4 IMPLANT
KIT TURNOVER KIT A (KITS) IMPLANT
NDL SAFETY ECLIPSE 18X1.5 (NEEDLE) ×2 IMPLANT
NEEDLE HYPO 18GX1.5 SHARP (NEEDLE) ×2
PACK ROBOT UROLOGY CUSTOM (CUSTOM PROCEDURE TRAY) ×4 IMPLANT
PENCIL SMOKE EVACUATOR (MISCELLANEOUS) IMPLANT
SEAL CANN UNIV 5-8 DVNC XI (MISCELLANEOUS) ×8 IMPLANT
SEAL XI 5MM-8MM UNIVERSAL (MISCELLANEOUS) ×8
SET IRRIG Y TYPE TUR BLADDER L (SET/KITS/TRAYS/PACK) ×4 IMPLANT
SET TUBE SMOKE EVAC HIGH FLOW (TUBING) ×4 IMPLANT
SOLUTION ELECTROLUBE (MISCELLANEOUS) ×4 IMPLANT
STAPLE RELOAD 45 GRN (STAPLE) ×2 IMPLANT
STAPLE RELOAD 45MM GREEN (STAPLE) ×2
SUT ETHILON 3 0 PS 1 (SUTURE) ×4 IMPLANT
SUT MNCRL 3 0 RB1 (SUTURE) ×2 IMPLANT
SUT MNCRL 3 0 VIOLET RB1 (SUTURE) ×2 IMPLANT
SUT MNCRL AB 4-0 PS2 18 (SUTURE) ×8 IMPLANT
SUT MONOCRYL 3 0 RB1 (SUTURE) ×4
SUT VIC AB 0 CT1 27 (SUTURE) ×2
SUT VIC AB 0 CT1 27XBRD ANTBC (SUTURE) ×2 IMPLANT
SUT VIC AB 0 UR5 27 (SUTURE) ×4 IMPLANT
SUT VIC AB 2-0 SH 27 (SUTURE) ×2
SUT VIC AB 2-0 SH 27X BRD (SUTURE) ×2 IMPLANT
SUT VICRYL 0 UR6 27IN ABS (SUTURE) ×8 IMPLANT
SYR 27GX1/2 1ML LL SAFETY (SYRINGE) ×4 IMPLANT
TOWEL OR NON WOVEN STRL DISP B (DISPOSABLE) ×4 IMPLANT
TROCAR XCEL NON-BLD 5MMX100MML (ENDOMECHANICALS) ×4 IMPLANT
WATER STERILE IRR 1000ML POUR (IV SOLUTION) ×4 IMPLANT

## 2020-02-19 NOTE — Op Note (Signed)

## 2020-02-19 NOTE — Anesthesia Postprocedure Evaluation (Signed)
Anesthesia Post Note  Patient: ROMEO ZIELINSKI  Procedure(s) Performed: XI ROBOTIC ASSISTED LAPAROSCOPIC RADICAL PROSTATECTOMY LEVEL 2 (N/A ) LYMPHADENECTOMY, PELVIC (Bilateral )     Patient location during evaluation: PACU Anesthesia Type: General Level of consciousness: awake and alert Pain management: pain level controlled Vital Signs Assessment: post-procedure vital signs reviewed and stable Respiratory status: spontaneous breathing, nonlabored ventilation, respiratory function stable and patient connected to nasal cannula oxygen Cardiovascular status: blood pressure returned to baseline and stable Postop Assessment: no apparent nausea or vomiting Anesthetic complications: no   No complications documented.  Last Vitals:  Vitals:   02/19/20 1700 02/19/20 1715  BP: (!) 163/66 (!) 164/69  Pulse: 84 87  Resp: 13 11  Temp:  36.4 C  SpO2: 97% 95%    Last Pain:  Vitals:   02/19/20 1715  TempSrc:   PainSc: [place]

## 2020-02-19 NOTE — Interval H&P Note (Signed)
History and Physical Interval Note:  02/19/2020 12:35 PM  Eugene Harding  has presented today for surgery, with the diagnosis of PROSTATE CANCER.  The various methods of treatment have been discussed with the patient and family. After consideration of risks, benefits and other options for treatment, the patient has consented to  Procedure(s): XI ROBOTIC ASSISTED LAPAROSCOPIC RADICAL PROSTATECTOMY LEVEL 2 (N/A) LYMPHADENECTOMY, PELVIC (Bilateral) as a surgical intervention.  The patient's history has been reviewed, patient examined, no change in status, stable for surgery.  I have reviewed the patient's chart and labs.  Questions were answered to the patient's satisfaction.     [employer]

## 2020-02-19 NOTE — Transfer of Care (Signed)
Immediate Anesthesia Transfer of Care Note  Patient: Eugene Harding  Procedure(s) Performed: XI ROBOTIC ASSISTED LAPAROSCOPIC RADICAL PROSTATECTOMY LEVEL 2 (N/A ) LYMPHADENECTOMY, PELVIC (Bilateral )  Patient Location: PACU  Anesthesia Type:General  Level of Consciousness: sedated, patient cooperative and responds to stimulation  Airway & Oxygen Therapy: Patient Spontanous Breathing and Patient connected to face mask oxygen  Post-op Assessment: Report given to RN and Post -op Vital signs reviewed and stable  Post vital signs: Reviewed and stable  Last Vitals:  Vitals Value Taken Time  BP 172/71 02/19/20 1631  Temp    Pulse 91 02/19/20 1634  Resp 13 02/19/20 1634  SpO2 100 % 02/19/20 1634  Vitals shown include unvalidated device data.  Last Pain:  Vitals:   02/19/20 0950  TempSrc: Oral  PainSc: 0-No pain      Patients Stated Pain Goal: 2 (86/82/57 4935)  Complications: No complications documented.

## 2020-02-19 NOTE — Discharge Instructions (Signed)
1. Activity:  You are encouraged to ambulate frequently (about every hour during waking hours) to help prevent blood clots from forming in your legs or lungs.  However, you should not engage in any heavy lifting (> 10-15 lbs), strenuous activity, or straining. 2. Diet: You should continue a clear liquid diet until passing gas from below.  Once this occurs, you may advance your diet to a soft diet that would be easy to digest (i.e soups, scrambled eggs, mashed potatoes, etc.) for 24 hours just as you would if getting over a bad stomach flu.  If tolerating this diet well for 24 hours, you may then begin eating regular food.  It will be normal to have some amount of bloating, nausea, and abdominal discomfort intermittently. 3. Prescriptions:  You will be provided a prescription for pain medication to take as needed.  If your pain is not severe enough to require the prescription pain medication, you may take Tylenol instead.  You should also take an over the counter stool softener (Colace 100 mg twice daily) to avoid straining with bowel movements as the pain medication may constipate you. Finally, you will also be provided a prescription for an antibiotic to begin the day prior to your return visit in the office for catheter removal. 4. Catheter care: You will be taught how to take care of the catheter by the nursing staff prior to discharge from the hospital.  You may use both a leg bag and the larger bedside bag but it is recommended to at least use the bigger bedside bag at nighttime as the leg bag is small and will fill up overnight and also does not drain as well when lying flat. You may periodically feel a strong urge to void with the catheter in place.  This is a bladder spasm and most often can occur when having a bowel movement or when you are moving around. It is typically self-limited and usually will stop after a few minutes.  You may use some Vaseline or Neosporin around the tip of the catheter to  reduce friction at the tip of the penis. 5. Incisions: You may remove your dressing bandages the 2nd day after surgery.  You most likely will have a few small staples in each of the incisions and once the bandages are removed, the incisions may stay open to air.  You may start showering (not soaking or bathing in water) 48 hours after surgery and the incisions simply need to be patted dry after the shower.  No additional care is needed. 6. What to call us about: You should call the office ([phone]) if you develop fever > 101, persistent vomiting, or the catheter stops draining. Also, feel free to call with any other questions you may have and remember the handout that was provided to you as a reference preoperatively which answers many of the common questions that arise after surgery. 7. You may resume advil, aleve, vitamins, and supplements 7 days after surgery.  

## 2020-02-19 NOTE — Anesthesia Procedure Notes (Signed)
Procedure Name: Intubation Performed by: Jaymere Alen J, CRNA Pre-anesthesia Checklist: Patient identified, Emergency Drugs available, Suction available, Patient being monitored and Timeout performed Patient Re-evaluated:Patient Re-evaluated prior to induction Oxygen Delivery Method: Circle system utilized Preoxygenation: Pre-oxygenation with 100% oxygen Induction Type: IV induction Ventilation: Mask ventilation without difficulty Laryngoscope Size: Mac and 4 Grade View: Grade II Tube type: Oral Tube size: 7.5 mm Number of attempts: 1 Airway Equipment and Method: Stylet Placement Confirmation: ETT inserted through vocal cords under direct vision,  positive ETCO2 and breath sounds checked- equal and bilateral Secured at: 23 cm Tube secured with: Tape Dental Injury: Teeth and Oropharynx as per pre-operative assessment        

## 2020-02-19 NOTE — Anesthesia Preprocedure Evaluation (Addendum)
Anesthesia Evaluation  Patient identified by MRN, date of birth, ID band Patient awake    Reviewed: Allergy & Precautions, NPO status , Patient's Chart, lab work & pertinent test results  History of Anesthesia Complications Negative for: history of anesthetic complications  Airway Mallampati: II  TM Distance: >3 FB Neck ROM: Full    Dental  (+) Dental Advisory Given, Teeth Intact   Pulmonary former smoker,    Pulmonary exam normal        Cardiovascular hypertension, Pt. on medications Normal cardiovascular exam   '18 TTE - mild LVH. EF 55% to 60%. Grade 1 diastolic dysfunction. Trivial MR and TR.    Neuro/Psych CVA (blurred vision, left arm weakness), Residual Symptoms negative psych ROS   GI/Hepatic negative GI ROS, Neg liver ROS,   Endo/Other  diabetes, Type 2, Oral Hypoglycemic Agents Obesity   Renal/GU negative Renal ROS    Prostate cancer     Musculoskeletal negative musculoskeletal ROS (+)   Abdominal   Peds  Hematology  Thrombocytopenia, Plt 133k    Anesthesia Other Findings Covid test negative   Reproductive/Obstetrics                            Anesthesia Physical Anesthesia Plan  ASA: III  Anesthesia Plan: General   Post-op Pain Management:    Induction: Intravenous  PONV Risk Score and Plan: 3 and Treatment may vary due to age or medical condition, Ondansetron, Dexamethasone and Midazolam  Airway Management Planned: Oral ETT  Additional Equipment: None  Intra-op Plan:   Post-operative Plan: Extubation in OR  Informed Consent: I have reviewed the patients History and Physical, chart, labs and discussed the procedure including the risks, benefits and alternatives for the proposed anesthesia with the patient or authorized representative who has indicated his/her understanding and acceptance.     Dental advisory given  Plan Discussed with: CRNA and  Anesthesiologist  Anesthesia Plan Comments:        Anesthesia Quick Evaluation

## 2020-02-20 ENCOUNTER — Other Ambulatory Visit: Payer: Self-pay

## 2020-02-20 ENCOUNTER — Encounter (HOSPITAL_COMMUNITY): Payer: Self-pay | Admitting: Urology

## 2020-02-20 DIAGNOSIS — I10 Essential (primary) hypertension: Secondary | ICD-10-CM | POA: Diagnosis not present

## 2020-02-20 DIAGNOSIS — Z87891 Personal history of nicotine dependence: Secondary | ICD-10-CM | POA: Diagnosis not present

## 2020-02-20 DIAGNOSIS — Z794 Long term (current) use of insulin: Secondary | ICD-10-CM | POA: Diagnosis not present

## 2020-02-20 DIAGNOSIS — Z79899 Other long term (current) drug therapy: Secondary | ICD-10-CM | POA: Diagnosis not present

## 2020-02-20 DIAGNOSIS — E785 Hyperlipidemia, unspecified: Secondary | ICD-10-CM | POA: Diagnosis not present

## 2020-02-20 DIAGNOSIS — M109 Gout, unspecified: Secondary | ICD-10-CM | POA: Diagnosis not present

## 2020-02-20 DIAGNOSIS — Z8673 Personal history of transient ischemic attack (TIA), and cerebral infarction without residual deficits: Secondary | ICD-10-CM | POA: Diagnosis not present

## 2020-02-20 DIAGNOSIS — D4102 Neoplasm of uncertain behavior of left kidney: Secondary | ICD-10-CM | POA: Diagnosis not present

## 2020-02-20 DIAGNOSIS — Z7901 Long term (current) use of anticoagulants: Secondary | ICD-10-CM | POA: Diagnosis not present

## 2020-02-20 DIAGNOSIS — E119 Type 2 diabetes mellitus without complications: Secondary | ICD-10-CM | POA: Diagnosis not present

## 2020-02-20 DIAGNOSIS — Z7982 Long term (current) use of aspirin: Secondary | ICD-10-CM | POA: Diagnosis not present

## 2020-02-20 DIAGNOSIS — Z8551 Personal history of malignant neoplasm of bladder: Secondary | ICD-10-CM | POA: Diagnosis not present

## 2020-02-20 DIAGNOSIS — C61 Malignant neoplasm of prostate: Secondary | ICD-10-CM | POA: Diagnosis not present

## 2020-02-20 LAB — HEMOGLOBIN AND HEMATOCRIT, BLOOD
HCT: 41.6 % (ref 39.0–52.0)
Hemoglobin: 13.5 g/dL (ref 13.0–17.0)

## 2020-02-20 LAB — GLUCOSE, CAPILLARY
Glucose-Capillary: 123 mg/dL — ABNORMAL HIGH (ref 70–99)
Glucose-Capillary: 156 mg/dL — ABNORMAL HIGH (ref 70–99)
Glucose-Capillary: 165 mg/dL — ABNORMAL HIGH (ref 70–99)
Glucose-Capillary: 199 mg/dL — ABNORMAL HIGH (ref 70–99)

## 2020-02-20 MED ORDER — BISACODYL 10 MG RE SUPP
10.0000 mg | Freq: Once | RECTAL | Status: AC
  Administered 2020-02-20: 10 mg via RECTAL
  Filled 2020-02-20: qty 1

## 2020-02-20 MED ORDER — TRAMADOL HCL 50 MG PO TABS
50.0000 mg | ORAL_TABLET | Freq: Four times a day (QID) | ORAL | Status: DC | PRN
  Administered 2020-02-20: 50 mg via ORAL
  Filled 2020-02-20: qty 1

## 2020-02-20 NOTE — Progress Notes (Signed)
Patient ID: Eugene Harding, male   DOB: 04/30/1961, 59 y.o.   MRN: [id number]  1 Day Post-Op Subjective: The patient is doing well.  No nausea or vomiting. Pain is adequately controlled.  Objective: Vital signs in last 24 hours: Temp:  [97.6 F (36.4 C)-98.4 F (36.9 C)] 98.1 F (36.7 C) (09/21 0529) Pulse Rate:  [84-108] 91 (09/21 0529) Resp:  [11-20] 16 (09/21 0529) BP: (144-172)/(62-73) 145/65 (09/21 0529) SpO2:  [95 %-100 %] 99 % (09/21 0529) Weight:  [85.5 kg] 85.5 kg (09/20 1751)  Intake/Output from previous day: 09/20 0701 - 09/21 0700 In: 4527 [P.O.:60; I.V.:3317; IV Piggyback:1150] Out: 1980 [Urine:1600; Drains:130; Blood:250] Intake/Output this shift: No intake/output data recorded.  Physical Exam:  General: Alert and oriented. CV: RRR Lungs: Clear bilaterally. GI: Soft, Nondistended. Incisions: Clean, dry, and intact Urine: Clear Extremities: Nontender, no erythema, no edema.  Lab Results: Recent Labs    02/19/20 1125 02/19/20 1648 02/20/20 0453  HGB 15.3 14.2 13.5  HCT 45.0 44.5 41.6      Assessment/Plan: POD# 1 s/p robotic prostatectomy.  1) SL IVF 2) Ambulate, Incentive spirometry 3) Transition to oral pain medication 4) Dulcolax suppository 5) D/C pelvic drain 6) Plan for likely discharge later today   Pryor Curia. MD   LOS: 0 days   Dutch Gray 02/20/2020, 7:17 AM

## 2020-02-20 NOTE — Progress Notes (Signed)
Patient ambulated on hall way  twice this shift, tolerated well.

## 2020-02-20 NOTE — Discharge Summary (Signed)
  Date of admission: 02/19/2020  Date of discharge: 02/20/2020  Admission diagnosis: Prostate Cancer  Discharge diagnosis: Prostate Cancer  History and Physical: For full details, please see admission history and physical. Briefly, Eugene Harding is a 59 y.o. gentleman with localized prostate cancer.  After discussing management/treatment options, he elected to proceed with surgical treatment.  Hospital Course: SILUS LANZO was taken to the operating room on 02/19/2020 and underwent a robotic assisted laparoscopic radical prostatectomy. He tolerated this procedure well and without complications. Postoperatively, he was able to be transferred to a regular hospital room following recovery from anesthesia.  He was able to begin ambulating the night of surgery. He remained hemodynamically stable overnight.  He had excellent urine output with appropriately minimal output from his pelvic drain and his pelvic drain was removed on POD #1.  He was transitioned to oral pain medication, tolerated a clear liquid diet, and had met all discharge criteria and was able to be discharged home later on POD#1.  Laboratory values: Recent Labs    02/19/20 1125 02/19/20 1648 02/20/20 0453  HGB 15.3 14.2 13.5  HCT 45.0 44.5 41.6    Disposition: Home  Discharge instruction: He was instructed to be ambulatory but to refrain from heavy lifting, strenuous activity, or driving. He was instructed on urethral catheter care.  Discharge medications:   Allergies as of 02/20/2020      Reactions   Chlorhexidine Other (See Comments)   Skin red patches after preop CHG bath      Medication List    TAKE these medications   amLODipine 5 MG tablet Commonly known as: NORVASC Take 5 mg by mouth daily.   aspirin 81 MG chewable tablet Chew 1 tablet (81 mg total) by mouth daily.   atorvastatin 80 MG tablet Commonly known as: LIPITOR Take 0.5 tablets (40 mg total) by mouth daily at 6 PM.   glucose blood test strip 1 each  by Other route as needed for other. Use as instructed   Jardiance 25 MG Tabs tablet Generic drug: empagliflozin Take 25 mg by mouth daily.   lisinopril 40 MG tablet Commonly known as: ZESTRIL Take 40 mg by mouth daily.   metFORMIN 1000 MG tablet Commonly known as: GLUCOPHAGE Take 1,000 mg by mouth 2 (two) times daily with a meal.   OneTouch Delica Lancets Fine Misc USE TO TEST BLOOD SUGAR ONCE DAILY   OneTouch Verio w/Device Kit USE TO TEST BLOOD SUGAR   sulfamethoxazole-trimethoprim 800-160 MG tablet Commonly known as: BACTRIM DS Take 1 tablet by mouth 2 (two) times daily. Start the day prior to foley removal appointment   traMADol 50 MG tablet Commonly known as: Ultram Take 1-2 tablets (50-100 mg total) by mouth every 6 (six) hours as needed for moderate pain or severe pain.       Followup: He will followup in 1 week for catheter removal and to discuss his surgical pathology results.

## 2020-02-23 LAB — SURGICAL PATHOLOGY

## 2020-03-20 DIAGNOSIS — M6281 Muscle weakness (generalized): Secondary | ICD-10-CM | POA: Diagnosis not present

## 2020-03-20 DIAGNOSIS — M62838 Other muscle spasm: Secondary | ICD-10-CM | POA: Diagnosis not present

## 2020-03-21 DIAGNOSIS — I10 Essential (primary) hypertension: Secondary | ICD-10-CM | POA: Diagnosis not present

## 2020-03-21 DIAGNOSIS — Z299 Encounter for prophylactic measures, unspecified: Secondary | ICD-10-CM | POA: Diagnosis not present

## 2020-03-21 DIAGNOSIS — E1165 Type 2 diabetes mellitus with hyperglycemia: Secondary | ICD-10-CM | POA: Diagnosis not present

## 2020-03-21 DIAGNOSIS — F17210 Nicotine dependence, cigarettes, uncomplicated: Secondary | ICD-10-CM | POA: Diagnosis not present

## 2020-05-07 DIAGNOSIS — E1159 Type 2 diabetes mellitus with other circulatory complications: Secondary | ICD-10-CM | POA: Diagnosis not present

## 2020-05-07 DIAGNOSIS — E1143 Type 2 diabetes mellitus with diabetic autonomic (poly)neuropathy: Secondary | ICD-10-CM | POA: Diagnosis not present

## 2020-05-29 DIAGNOSIS — Z0000 Encounter for general adult medical examination without abnormal findings: Secondary | ICD-10-CM | POA: Diagnosis not present

## 2020-05-29 DIAGNOSIS — Z7189 Other specified counseling: Secondary | ICD-10-CM | POA: Diagnosis not present

## 2020-05-29 DIAGNOSIS — Z79899 Other long term (current) drug therapy: Secondary | ICD-10-CM | POA: Diagnosis not present

## 2020-05-29 DIAGNOSIS — E7800 Pure hypercholesterolemia, unspecified: Secondary | ICD-10-CM | POA: Diagnosis not present

## 2020-05-29 DIAGNOSIS — E1165 Type 2 diabetes mellitus with hyperglycemia: Secondary | ICD-10-CM | POA: Diagnosis not present

## 2020-05-29 DIAGNOSIS — Z299 Encounter for prophylactic measures, unspecified: Secondary | ICD-10-CM | POA: Diagnosis not present

## 2020-06-07 DIAGNOSIS — Z8551 Personal history of malignant neoplasm of bladder: Secondary | ICD-10-CM | POA: Diagnosis not present

## 2020-06-24 DIAGNOSIS — I10 Essential (primary) hypertension: Secondary | ICD-10-CM | POA: Diagnosis not present

## 2020-06-24 DIAGNOSIS — G4700 Insomnia, unspecified: Secondary | ICD-10-CM | POA: Diagnosis not present

## 2020-06-24 DIAGNOSIS — Z299 Encounter for prophylactic measures, unspecified: Secondary | ICD-10-CM | POA: Diagnosis not present

## 2020-06-24 DIAGNOSIS — E1165 Type 2 diabetes mellitus with hyperglycemia: Secondary | ICD-10-CM | POA: Diagnosis not present

## 2020-07-10 DIAGNOSIS — I10 Essential (primary) hypertension: Secondary | ICD-10-CM | POA: Diagnosis not present

## 2020-07-10 DIAGNOSIS — E1165 Type 2 diabetes mellitus with hyperglycemia: Secondary | ICD-10-CM | POA: Diagnosis not present

## 2020-07-10 DIAGNOSIS — D485 Neoplasm of uncertain behavior of skin: Secondary | ICD-10-CM | POA: Diagnosis not present

## 2020-07-10 DIAGNOSIS — L989 Disorder of the skin and subcutaneous tissue, unspecified: Secondary | ICD-10-CM | POA: Diagnosis not present

## 2020-07-10 DIAGNOSIS — Z789 Other specified health status: Secondary | ICD-10-CM | POA: Diagnosis not present

## 2020-07-10 DIAGNOSIS — L570 Actinic keratosis: Secondary | ICD-10-CM | POA: Diagnosis not present

## 2020-07-10 DIAGNOSIS — Z299 Encounter for prophylactic measures, unspecified: Secondary | ICD-10-CM | POA: Diagnosis not present

## 2020-09-24 DIAGNOSIS — Z299 Encounter for prophylactic measures, unspecified: Secondary | ICD-10-CM | POA: Diagnosis not present

## 2020-09-24 DIAGNOSIS — E1165 Type 2 diabetes mellitus with hyperglycemia: Secondary | ICD-10-CM | POA: Diagnosis not present

## 2020-09-24 DIAGNOSIS — I10 Essential (primary) hypertension: Secondary | ICD-10-CM | POA: Diagnosis not present

## 2020-09-24 DIAGNOSIS — L309 Dermatitis, unspecified: Secondary | ICD-10-CM | POA: Diagnosis not present

## 2020-12-12 DIAGNOSIS — N281 Cyst of kidney, acquired: Secondary | ICD-10-CM | POA: Diagnosis not present

## 2020-12-12 DIAGNOSIS — D4102 Neoplasm of uncertain behavior of left kidney: Secondary | ICD-10-CM | POA: Diagnosis not present

## 2020-12-12 DIAGNOSIS — K7689 Other specified diseases of liver: Secondary | ICD-10-CM | POA: Diagnosis not present

## 2020-12-24 DIAGNOSIS — Z8551 Personal history of malignant neoplasm of bladder: Secondary | ICD-10-CM | POA: Diagnosis not present

## 2020-12-26 DIAGNOSIS — Z299 Encounter for prophylactic measures, unspecified: Secondary | ICD-10-CM | POA: Diagnosis not present

## 2020-12-26 DIAGNOSIS — Z87891 Personal history of nicotine dependence: Secondary | ICD-10-CM | POA: Diagnosis not present

## 2020-12-26 DIAGNOSIS — E119 Type 2 diabetes mellitus without complications: Secondary | ICD-10-CM | POA: Diagnosis not present

## 2020-12-26 DIAGNOSIS — I10 Essential (primary) hypertension: Secondary | ICD-10-CM | POA: Diagnosis not present

## 2020-12-26 DIAGNOSIS — E1165 Type 2 diabetes mellitus with hyperglycemia: Secondary | ICD-10-CM | POA: Diagnosis not present

## 2021-02-05 DIAGNOSIS — I10 Essential (primary) hypertension: Secondary | ICD-10-CM | POA: Diagnosis not present

## 2021-02-05 DIAGNOSIS — Z299 Encounter for prophylactic measures, unspecified: Secondary | ICD-10-CM | POA: Diagnosis not present

## 2021-04-01 DIAGNOSIS — E1165 Type 2 diabetes mellitus with hyperglycemia: Secondary | ICD-10-CM | POA: Diagnosis not present

## 2021-04-01 DIAGNOSIS — Z2821 Immunization not carried out because of patient refusal: Secondary | ICD-10-CM | POA: Diagnosis not present

## 2021-04-01 DIAGNOSIS — Z79899 Other long term (current) drug therapy: Secondary | ICD-10-CM | POA: Diagnosis not present

## 2021-04-01 DIAGNOSIS — Z0000 Encounter for general adult medical examination without abnormal findings: Secondary | ICD-10-CM | POA: Diagnosis not present

## 2021-04-01 DIAGNOSIS — R5383 Other fatigue: Secondary | ICD-10-CM | POA: Diagnosis not present

## 2021-04-01 DIAGNOSIS — E7800 Pure hypercholesterolemia, unspecified: Secondary | ICD-10-CM | POA: Diagnosis not present

## 2021-04-01 DIAGNOSIS — I10 Essential (primary) hypertension: Secondary | ICD-10-CM | POA: Diagnosis not present

## 2021-04-01 DIAGNOSIS — Z299 Encounter for prophylactic measures, unspecified: Secondary | ICD-10-CM | POA: Diagnosis not present

## 2021-04-01 DIAGNOSIS — Z7189 Other specified counseling: Secondary | ICD-10-CM | POA: Diagnosis not present

## 2021-07-04 DIAGNOSIS — I10 Essential (primary) hypertension: Secondary | ICD-10-CM | POA: Diagnosis not present

## 2021-07-04 DIAGNOSIS — Z299 Encounter for prophylactic measures, unspecified: Secondary | ICD-10-CM | POA: Diagnosis not present

## 2021-07-04 DIAGNOSIS — R42 Dizziness and giddiness: Secondary | ICD-10-CM | POA: Diagnosis not present

## 2021-07-04 DIAGNOSIS — Z87891 Personal history of nicotine dependence: Secondary | ICD-10-CM | POA: Diagnosis not present

## 2021-07-04 DIAGNOSIS — E1165 Type 2 diabetes mellitus with hyperglycemia: Secondary | ICD-10-CM | POA: Diagnosis not present

## 2021-08-01 DIAGNOSIS — D4102 Neoplasm of uncertain behavior of left kidney: Secondary | ICD-10-CM | POA: Diagnosis not present

## 2021-08-01 DIAGNOSIS — Z8551 Personal history of malignant neoplasm of bladder: Secondary | ICD-10-CM | POA: Diagnosis not present

## 2021-08-18 DIAGNOSIS — Z299 Encounter for prophylactic measures, unspecified: Secondary | ICD-10-CM | POA: Diagnosis not present

## 2021-08-18 DIAGNOSIS — I10 Essential (primary) hypertension: Secondary | ICD-10-CM | POA: Diagnosis not present

## 2021-08-18 DIAGNOSIS — I69359 Hemiplegia and hemiparesis following cerebral infarction affecting unspecified side: Secondary | ICD-10-CM | POA: Diagnosis not present

## 2021-08-18 DIAGNOSIS — Z87891 Personal history of nicotine dependence: Secondary | ICD-10-CM | POA: Diagnosis not present

## 2021-08-18 DIAGNOSIS — Z0000 Encounter for general adult medical examination without abnormal findings: Secondary | ICD-10-CM | POA: Diagnosis not present

## 2021-10-10 DIAGNOSIS — Z1211 Encounter for screening for malignant neoplasm of colon: Secondary | ICD-10-CM | POA: Diagnosis not present

## 2021-10-10 DIAGNOSIS — Z87891 Personal history of nicotine dependence: Secondary | ICD-10-CM | POA: Diagnosis not present

## 2021-10-10 DIAGNOSIS — I10 Essential (primary) hypertension: Secondary | ICD-10-CM | POA: Diagnosis not present

## 2021-10-10 DIAGNOSIS — Z299 Encounter for prophylactic measures, unspecified: Secondary | ICD-10-CM | POA: Diagnosis not present

## 2021-10-10 DIAGNOSIS — E1165 Type 2 diabetes mellitus with hyperglycemia: Secondary | ICD-10-CM | POA: Diagnosis not present

## 2022-01-13 DIAGNOSIS — I10 Essential (primary) hypertension: Secondary | ICD-10-CM | POA: Diagnosis not present

## 2022-01-13 DIAGNOSIS — Z8551 Personal history of malignant neoplasm of bladder: Secondary | ICD-10-CM | POA: Diagnosis not present

## 2022-01-13 DIAGNOSIS — E1165 Type 2 diabetes mellitus with hyperglycemia: Secondary | ICD-10-CM | POA: Diagnosis not present

## 2022-01-13 DIAGNOSIS — Z299 Encounter for prophylactic measures, unspecified: Secondary | ICD-10-CM | POA: Diagnosis not present

## 2022-01-13 DIAGNOSIS — C642 Malignant neoplasm of left kidney, except renal pelvis: Secondary | ICD-10-CM | POA: Diagnosis not present

## 2022-01-20 DIAGNOSIS — E119 Type 2 diabetes mellitus without complications: Secondary | ICD-10-CM | POA: Diagnosis not present

## 2022-01-20 DIAGNOSIS — H2513 Age-related nuclear cataract, bilateral: Secondary | ICD-10-CM | POA: Diagnosis not present

## 2022-01-20 DIAGNOSIS — H40013 Open angle with borderline findings, low risk, bilateral: Secondary | ICD-10-CM | POA: Diagnosis not present

## 2022-02-23 DIAGNOSIS — K769 Liver disease, unspecified: Secondary | ICD-10-CM | POA: Diagnosis not present

## 2022-03-13 DIAGNOSIS — Z8551 Personal history of malignant neoplasm of bladder: Secondary | ICD-10-CM | POA: Diagnosis not present

## 2022-04-07 DIAGNOSIS — Z299 Encounter for prophylactic measures, unspecified: Secondary | ICD-10-CM | POA: Diagnosis not present

## 2022-04-07 DIAGNOSIS — R5383 Other fatigue: Secondary | ICD-10-CM | POA: Diagnosis not present

## 2022-04-07 DIAGNOSIS — Z0000 Encounter for general adult medical examination without abnormal findings: Secondary | ICD-10-CM | POA: Diagnosis not present

## 2022-04-07 DIAGNOSIS — Z79899 Other long term (current) drug therapy: Secondary | ICD-10-CM | POA: Diagnosis not present

## 2022-04-07 DIAGNOSIS — Z7189 Other specified counseling: Secondary | ICD-10-CM | POA: Diagnosis not present

## 2022-04-07 DIAGNOSIS — Z2821 Immunization not carried out because of patient refusal: Secondary | ICD-10-CM | POA: Diagnosis not present

## 2022-04-07 DIAGNOSIS — I10 Essential (primary) hypertension: Secondary | ICD-10-CM | POA: Diagnosis not present

## 2022-04-17 DIAGNOSIS — Z299 Encounter for prophylactic measures, unspecified: Secondary | ICD-10-CM | POA: Diagnosis not present

## 2022-04-17 DIAGNOSIS — I10 Essential (primary) hypertension: Secondary | ICD-10-CM | POA: Diagnosis not present

## 2022-04-17 DIAGNOSIS — H109 Unspecified conjunctivitis: Secondary | ICD-10-CM | POA: Diagnosis not present

## 2022-04-20 DIAGNOSIS — Z299 Encounter for prophylactic measures, unspecified: Secondary | ICD-10-CM | POA: Diagnosis not present

## 2022-04-20 DIAGNOSIS — H109 Unspecified conjunctivitis: Secondary | ICD-10-CM | POA: Diagnosis not present

## 2022-04-20 DIAGNOSIS — I700 Atherosclerosis of aorta: Secondary | ICD-10-CM | POA: Diagnosis not present

## 2022-04-20 DIAGNOSIS — Z713 Dietary counseling and surveillance: Secondary | ICD-10-CM | POA: Diagnosis not present

## 2022-04-20 DIAGNOSIS — I10 Essential (primary) hypertension: Secondary | ICD-10-CM | POA: Diagnosis not present

## 2022-05-05 IMAGING — MR MR ABDOMEN WO/W CM
17 series · 48 of 48 positions shown · IV contrast (multihance)
Comparison: 07/30/2019 CT abdomen/pelvis.

CLINICAL DATA: Indeterminate left renal lesion. History of prostate
and bladder cancer.

EXAM:
MRI ABDOMEN WITHOUT AND WITH CONTRAST
TECHNIQUE: Multiplanar multisequence MR imaging of the abdomen was performed
both before and after the administration of intravenous contrast.
CONTRAST:  17mL MULTIHANCE GADOBENATE DIMEGLUMINE 529 MG/ML IV SOLN

[Series 3: T2 · coronal · 5.0mm · 0.78mm/px · 1 of 18 slices shown (1 of 3)]
[im 1/18]
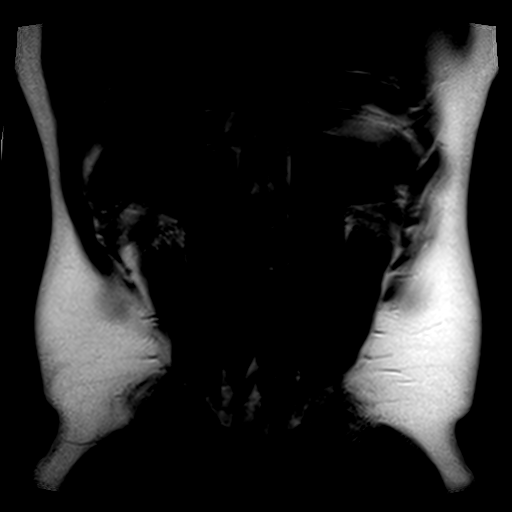

[Series 4: T2 · axial · 5.0mm · 0.66mm/px · 1 of 27 slices shown (2 of 3)]
[im 1/27]
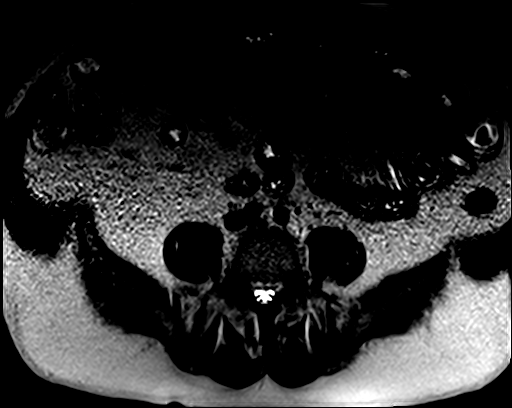

[Series 5: bSSFP · axial · 4.0mm · 0.74mm/px · 1 of 38 slices shown]
[im 1/38]
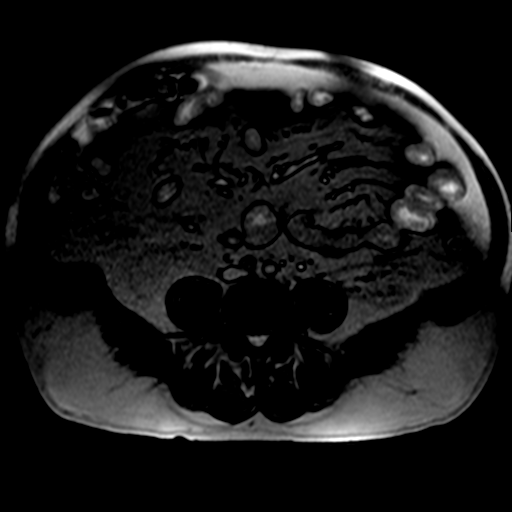

[Series 6: T1 · axial · 5.0mm · 0.66mm/px · z∈[-118,+32]mm · 2 of 52 slices shown]
[im 1/52]
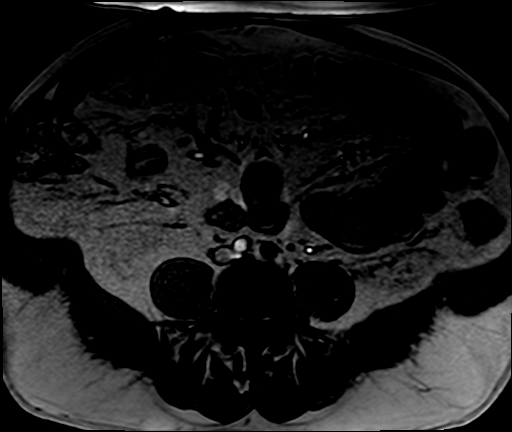
[im 52/52]
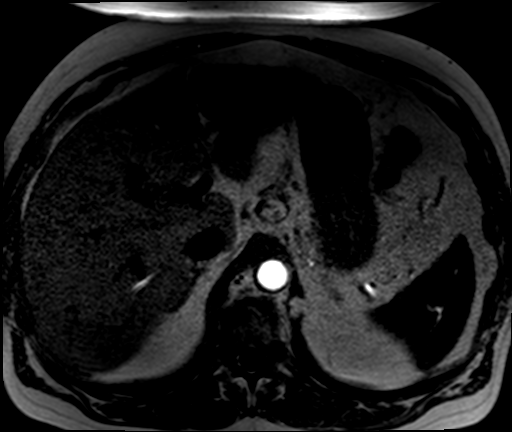

[Series 7: ep2d_diff_b50_500_800_p2_trig · axial · 6.0mm · 1.98mm/px · z∈[-87,+69]mm · 2 of 63 slices shown]
[im 1/63]
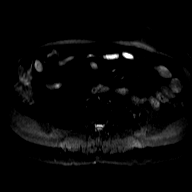
[im 63/63]
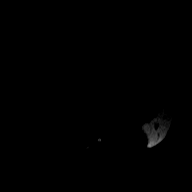

[Series 8: ep2d_diff_b50_500_800_p2_trig_adc · axial · 6.0mm · 1.98mm/px · 1 of 21 slices shown]
[im 1/21]
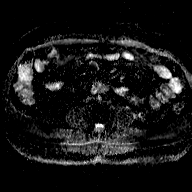

[Series 9: T2 · axial · 5.0mm · 1.33mm/px · 1 of 25 slices shown (3 of 3)]
[im 1/25]
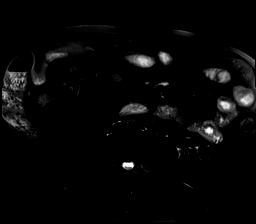

[Series 10: T1 dynamic · axial · non-contrast · 2.3mm · 1.41mm/px · z∈[-120,+25]mm · 4 of 64 slices shown]
[im 1/64]
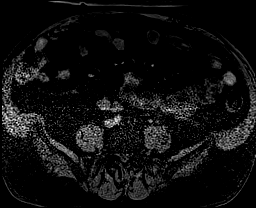
[im 22/64]
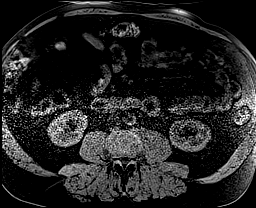
[im 43/64]
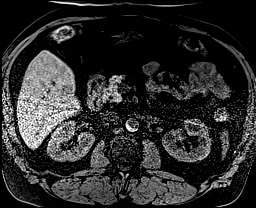
[im 64/64]
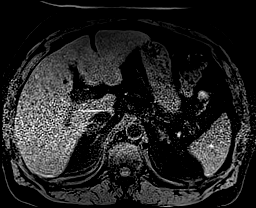

[Series 11: post 25 sec · axial · 2.3mm · 1.41mm/px · z∈[-120,+25]mm · 4 of 64 slices shown]
[im 1/64]
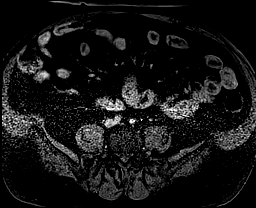
[im 22/64]
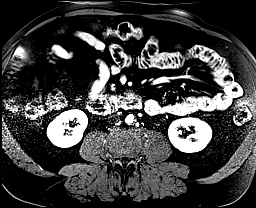
[im 43/64]
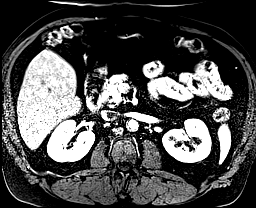
[im 64/64]
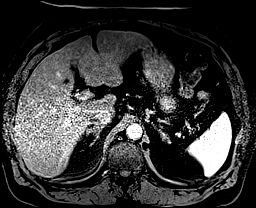

[Series 12: post 25 sec_sub · axial · 2.3mm · 1.41mm/px · z∈[-120,+25]mm · 4 of 64 slices shown]
[im 1/64]
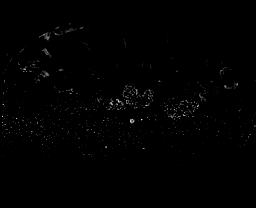
[im 22/64]
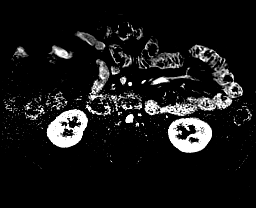
[im 43/64]
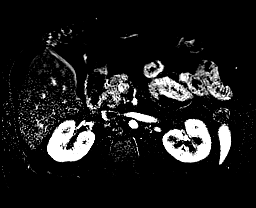
[im 64/64]
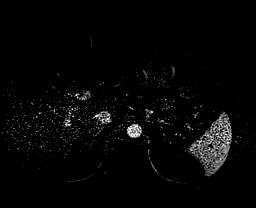

[Series 13: post 45 sec · axial · 2.3mm · 1.41mm/px · z∈[-120,+25]mm · 4 of 64 slices shown]
[im 1/64]
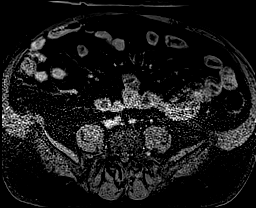
[im 22/64]
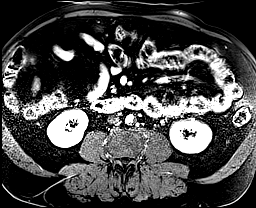
[im 43/64]
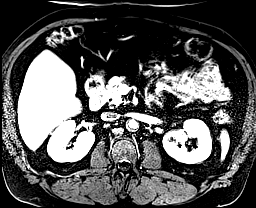
[im 64/64]
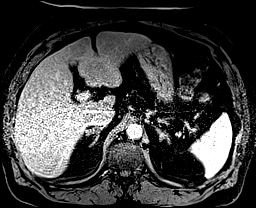

[Series 14: post 45 sec_sub · axial · 2.3mm · 1.41mm/px · z∈[-120,+25]mm · 4 of 64 slices shown]
[im 1/64]
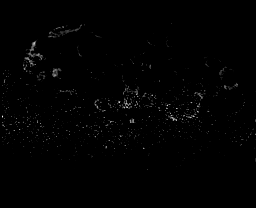
[im 22/64]
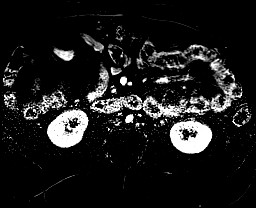
[im 43/64]
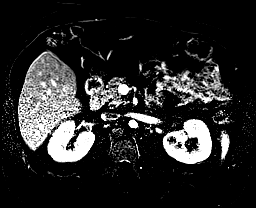
[im 64/64]
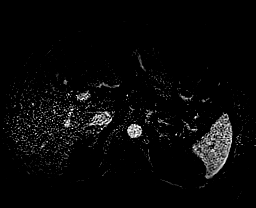

[Series 15: post 90 sec · axial · 2.3mm · 1.41mm/px · z∈[-120,+25]mm · 4 of 64 slices shown]
[im 1/64]
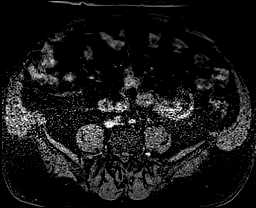
[im 22/64]
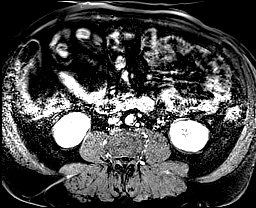
[im 43/64]
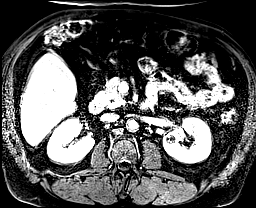
[im 64/64]
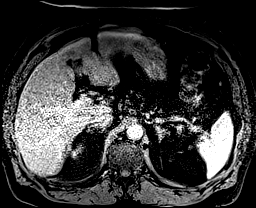

[Series 16: post 90 sec_sub · axial · 2.3mm · 1.41mm/px · z∈[-120,+25]mm · 4 of 64 slices shown]
[im 1/64]
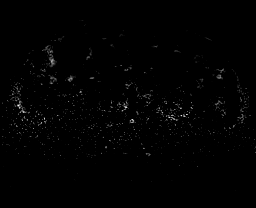
[im 22/64]
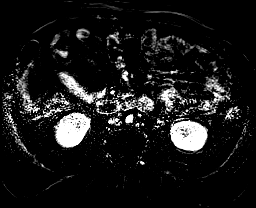
[im 43/64]
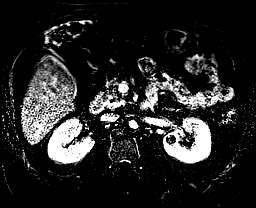
[im 64/64]
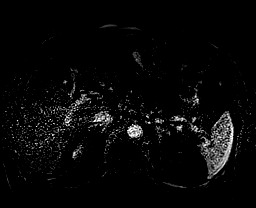

[Series 17: T1 dynamic post-contrast · coronal · 2.6mm · 0.78mm/px · 3 of 52 slices shown]
[im 1/52]
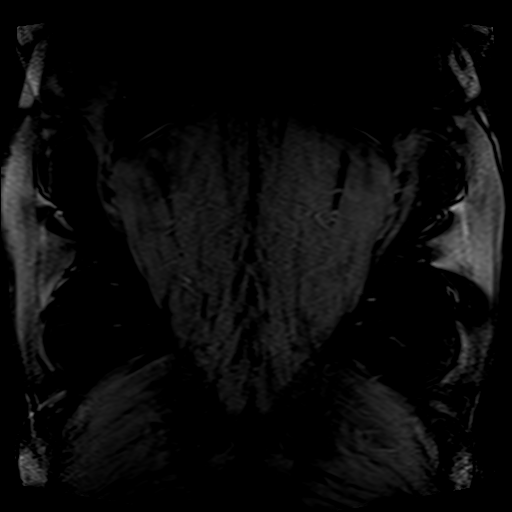
[im 26/52]
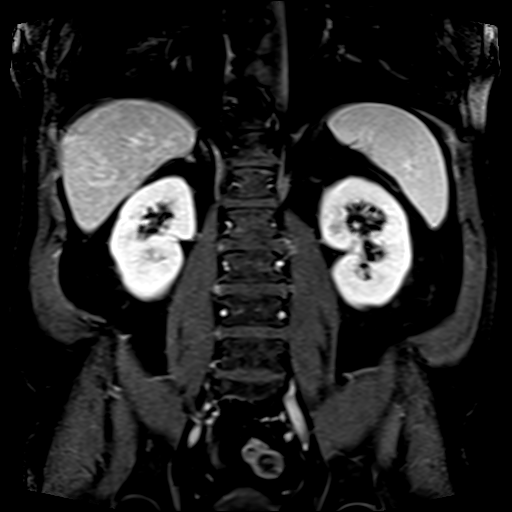
[im 52/52]
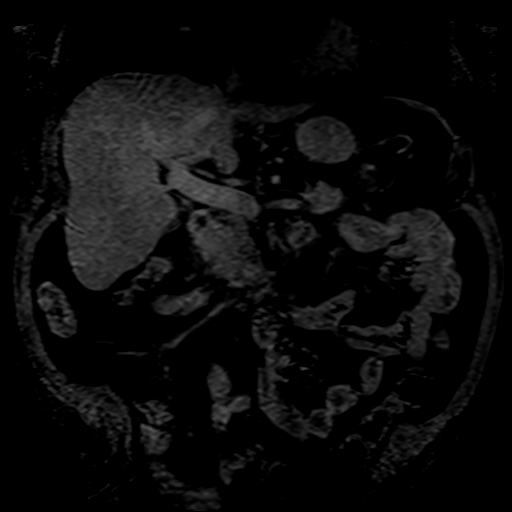

[Series 18: post axial 3+ · axial · 2.3mm · 1.41mm/px · z∈[-120,+25]mm · 4 of 64 slices shown (1 of 2)]
[im 1/64]
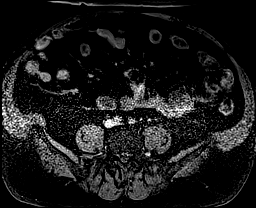
[im 22/64]
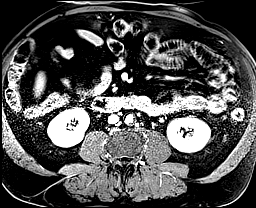
[im 43/64]
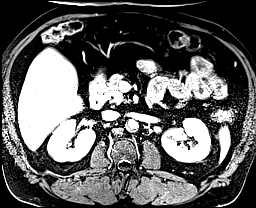
[im 64/64]
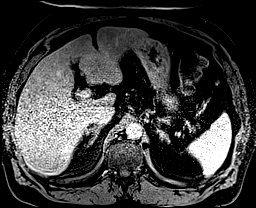

[Series 19: post axial 3+ · axial · 2.3mm · 1.41mm/px · z∈[-120,+25]mm · 4 of 64 slices shown (2 of 2)]
[im 1/64]
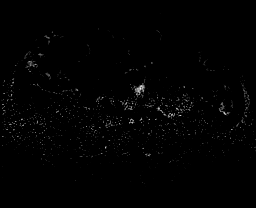
[im 22/64]
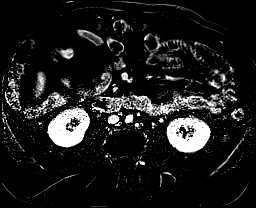
[im 43/64]
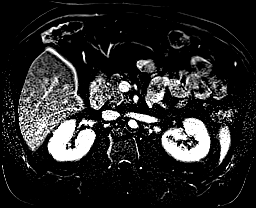
[im 64/64]
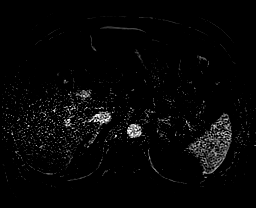

[48 of 48 positions shown; findings below may reference images not displayed]

FINDINGS: Lower chest: No acute abnormality at the lung bases.

Hepatobiliary: Normal liver size and configuration. Mild diffuse
hepatic steatosis. A few scattered tiny subcentimeter simple liver
cysts in the visualized liver. No suspicious liver masses. Tiny
layering gallstone in the otherwise normal gallbladder with no
gallbladder wall thickening, gallbladder distention or
pericholecystic fluid. No biliary ductal dilatation. Common bile
duct diameter 3 mm. No choledocholithiasis. No biliary masses,
strictures or beading.

Pancreas: No pancreatic mass or duct dilation.  No pancreas divisum.

Spleen: Normal size. No mass.

Adrenals/Urinary Tract: No right adrenal nodules. Stable 1.5 cm left
adrenal nodule with loss of signal intensity on out of phase
chemical shift imaging compatible with a benign adenoma. No
hydronephrosis. There is a 1.6 x 1.6 cm cystic renal cortical lesion
in the medial interpolar left kidney (series 4/image 12) with a few
thin internal septations with perceived enhancement, considered
Bosniak category IIF, stable in size since 07/30/2019 CT.

Stomach/Bowel: Normal non-distended stomach. Visualized small and
large bowel is normal caliber, with no bowel wall thickening.

Vascular/Lymphatic: Atherosclerotic nonaneurysmal abdominal aorta.
Patent portal, splenic, hepatic and renal veins. No pathologically
enlarged lymph nodes in the abdomen.

Other: No abdominal ascites or focal fluid collection.

Musculoskeletal: No aggressive appearing focal osseous lesions.
IMPRESSION: 1. Small 1.6 cm Bosniak category IIF complex cystic renal cortical
lesion in the medial interpolar left kidney, stable in size since
07/30/2019 CT. Initial follow-up MRI abdomen without and with IV
contrast in 6-12 months.
2. Cholelithiasis. No biliary ductal dilatation.
3. Mild diffuse hepatic steatosis.
4. Stable benign left adrenal adenoma.

## 2022-06-09 DIAGNOSIS — Z87891 Personal history of nicotine dependence: Secondary | ICD-10-CM | POA: Diagnosis not present

## 2022-06-09 DIAGNOSIS — I10 Essential (primary) hypertension: Secondary | ICD-10-CM | POA: Diagnosis not present

## 2022-06-09 DIAGNOSIS — E1165 Type 2 diabetes mellitus with hyperglycemia: Secondary | ICD-10-CM | POA: Diagnosis not present

## 2022-06-09 DIAGNOSIS — Z299 Encounter for prophylactic measures, unspecified: Secondary | ICD-10-CM | POA: Diagnosis not present

## 2022-06-15 DIAGNOSIS — E119 Type 2 diabetes mellitus without complications: Secondary | ICD-10-CM | POA: Diagnosis not present

## 2022-07-21 DIAGNOSIS — Z87891 Personal history of nicotine dependence: Secondary | ICD-10-CM | POA: Diagnosis not present

## 2022-07-21 DIAGNOSIS — Z299 Encounter for prophylactic measures, unspecified: Secondary | ICD-10-CM | POA: Diagnosis not present

## 2022-07-21 DIAGNOSIS — I10 Essential (primary) hypertension: Secondary | ICD-10-CM | POA: Diagnosis not present

## 2022-07-21 DIAGNOSIS — E1165 Type 2 diabetes mellitus with hyperglycemia: Secondary | ICD-10-CM | POA: Diagnosis not present

## 2022-08-19 DIAGNOSIS — I700 Atherosclerosis of aorta: Secondary | ICD-10-CM | POA: Diagnosis not present

## 2022-08-19 DIAGNOSIS — I10 Essential (primary) hypertension: Secondary | ICD-10-CM | POA: Diagnosis not present

## 2022-08-19 DIAGNOSIS — Z87891 Personal history of nicotine dependence: Secondary | ICD-10-CM | POA: Diagnosis not present

## 2022-08-19 DIAGNOSIS — N281 Cyst of kidney, acquired: Secondary | ICD-10-CM | POA: Diagnosis not present

## 2022-08-19 DIAGNOSIS — Z299 Encounter for prophylactic measures, unspecified: Secondary | ICD-10-CM | POA: Diagnosis not present

## 2022-08-19 DIAGNOSIS — E1165 Type 2 diabetes mellitus with hyperglycemia: Secondary | ICD-10-CM | POA: Diagnosis not present

## 2022-10-22 DIAGNOSIS — E7800 Pure hypercholesterolemia, unspecified: Secondary | ICD-10-CM | POA: Diagnosis not present

## 2022-10-22 DIAGNOSIS — Z79899 Other long term (current) drug therapy: Secondary | ICD-10-CM | POA: Diagnosis not present

## 2022-10-22 DIAGNOSIS — Z0000 Encounter for general adult medical examination without abnormal findings: Secondary | ICD-10-CM | POA: Diagnosis not present

## 2022-10-22 DIAGNOSIS — R5383 Other fatigue: Secondary | ICD-10-CM | POA: Diagnosis not present

## 2022-10-22 DIAGNOSIS — I10 Essential (primary) hypertension: Secondary | ICD-10-CM | POA: Diagnosis not present

## 2022-10-22 DIAGNOSIS — E1165 Type 2 diabetes mellitus with hyperglycemia: Secondary | ICD-10-CM | POA: Diagnosis not present

## 2022-10-22 DIAGNOSIS — Z299 Encounter for prophylactic measures, unspecified: Secondary | ICD-10-CM | POA: Diagnosis not present

## 2022-10-22 DIAGNOSIS — I700 Atherosclerosis of aorta: Secondary | ICD-10-CM | POA: Diagnosis not present

## 2022-11-02 DIAGNOSIS — Z1212 Encounter for screening for malignant neoplasm of rectum: Secondary | ICD-10-CM | POA: Diagnosis not present

## 2022-11-02 DIAGNOSIS — Z1211 Encounter for screening for malignant neoplasm of colon: Secondary | ICD-10-CM | POA: Diagnosis not present

## 2022-11-09 DIAGNOSIS — Z299 Encounter for prophylactic measures, unspecified: Secondary | ICD-10-CM | POA: Diagnosis not present

## 2022-11-09 DIAGNOSIS — I10 Essential (primary) hypertension: Secondary | ICD-10-CM | POA: Diagnosis not present

## 2022-11-23 DIAGNOSIS — Z299 Encounter for prophylactic measures, unspecified: Secondary | ICD-10-CM | POA: Diagnosis not present

## 2022-11-23 DIAGNOSIS — I10 Essential (primary) hypertension: Secondary | ICD-10-CM | POA: Diagnosis not present

## 2022-11-23 DIAGNOSIS — E1165 Type 2 diabetes mellitus with hyperglycemia: Secondary | ICD-10-CM | POA: Diagnosis not present

## 2022-11-26 ENCOUNTER — Encounter: Payer: Self-pay | Admitting: *Deleted

## 2022-12-09 DIAGNOSIS — Z8551 Personal history of malignant neoplasm of bladder: Secondary | ICD-10-CM | POA: Diagnosis not present

## 2022-12-18 ENCOUNTER — Encounter: Payer: Self-pay | Admitting: Gastroenterology

## 2022-12-18 ENCOUNTER — Other Ambulatory Visit: Payer: Self-pay | Admitting: *Deleted

## 2022-12-18 ENCOUNTER — Telehealth: Payer: Self-pay | Admitting: *Deleted

## 2022-12-18 ENCOUNTER — Encounter: Payer: Self-pay | Admitting: *Deleted

## 2022-12-18 ENCOUNTER — Ambulatory Visit (INDEPENDENT_AMBULATORY_CARE_PROVIDER_SITE_OTHER): Payer: Medicare Other | Admitting: Gastroenterology

## 2022-12-18 VITALS — BP 131/69 | HR 88 | Temp 98.6°F | Ht 66.0 in | Wt 190.4 lb

## 2022-12-18 DIAGNOSIS — R195 Other fecal abnormalities: Secondary | ICD-10-CM

## 2022-12-18 MED ORDER — PEG 3350-KCL-NA BICARB-NACL 420 G PO SOLR: 4000.0000 mL | Freq: Once | ORAL | 0 refills | Status: AC

## 2022-12-18 NOTE — Progress Notes (Addendum)
GI Office Note    Referring Provider: Kirstie Peri, MD Primary Care Physician:  Kirstie Peri, MD  Primary Gastroenterologist: Hennie Duos. Marletta Lor, DO   Chief Complaint   Chief Complaint  Patient presents with   cologuard positive     History of Present Illness   Eugene Harding is a 62 y.o. male presenting today at the request of Dr. Sherryll Burger for further evaluation of positive Cologuard.  Patient has never had a colonoscopy. No FH colon cancer but father/brother/sister have all had colon polyps before age of 72.   He denies constipation, diarrhea, melena, brbpr. No abdominal pain. No heartburn, dysphagia, unintentional weight loss. No N/V.   Medications   Current Outpatient Medications  Medication Sig Dispense Refill   amLODipine (NORVASC) 5 MG tablet Take 5 mg by mouth daily.      aspirin 81 MG chewable tablet Chew 1 tablet (81 mg total) by mouth daily. 30 tablet 0   atorvastatin (LIPITOR) 80 MG tablet Take 0.5 tablets (40 mg total) by mouth daily at 6 PM. 45 tablet 3   empagliflozin (JARDIANCE) 25 MG TABS tablet Take 25 mg by mouth daily.     glimepiride (AMARYL) 2 MG tablet Take 2 mg by mouth daily with breakfast.     lisinopril (PRINIVIL,ZESTRIL) 40 MG tablet Take 40 mg by mouth daily.     meclizine (ANTIVERT) 25 MG tablet Take 25 mg by mouth daily.     metFORMIN (GLUCOPHAGE) 1000 MG tablet Take 1,000 mg by mouth 2 (two) times daily with a meal.      Blood Glucose Monitoring Suppl (ONETOUCH VERIO) w/Device KIT USE TO TEST BLOOD SUGAR  3   glucose blood test strip 1 each by Other route as needed for other. Use as instructed     ONETOUCH DELICA LANCETS FINE MISC USE TO TEST BLOOD SUGAR ONCE DAILY  2   No current facility-administered medications for this visit.    Allergies   Allergies as of 12/18/2022 - Review Complete 02/19/2020  Allergen Reaction Noted   Chlorhexidine Other (See Comments) 02/19/2020    Past Medical History   Past Medical History:  Diagnosis  Date   Bladder cancer [facility])    Essential hypertension    History of stroke    Dizziness and numbness in left hand and leg   Prostate cancer (HCC)    Stroke (HCC)    2018   Type 2 diabetes mellitus (HCC)     Past Surgical History   Past Surgical History:  Procedure Laterality Date   LYMPHADENECTOMY Bilateral 02/19/2020   Procedure: LYMPHADENECTOMY, PELVIC;  Surgeon: Heloise Purpura, MD;  Location: [facility];  Service: Urology;  Laterality: Bilateral;   NECK EXPLORATION  1981   PROSTATE BIOPSY N/A 11/03/2019   Procedure: BIOPSY TRANSRECTAL ULTRASONIC PROSTATE (TUBP);  Surgeon: Jerilee Field, MD;  Location: [facility];  Service: Urology;  Laterality: N/A;   ROBOT ASSISTED LAPAROSCOPIC RADICAL PROSTATECTOMY N/A 02/19/2020   Procedure: XI ROBOTIC ASSISTED LAPAROSCOPIC RADICAL PROSTATECTOMY LEVEL 2;  Surgeon: Heloise Purpura, MD;  Location: [facility];  Service: Urology;  Laterality: N/A;   TRANSURETHRAL RESECTION OF BLADDER TUMOR N/A 11/03/2019   Procedure: TRANSURETHRAL RESECTION OF BLADDER TUMOR (TURBT) 0.5-2.0 cm, POSSIBLE LEFT STENT PLACEMENT;  Surgeon: Jerilee Field, MD;  Location: [facility];  Service: Urology;  Laterality: N/A;    Past Family History   Family History  Problem Relation Age of Onset   Colon polyps Father    Colon polyps Sister  age <60   Heart disease Brother    Colon polyps Brother        age <42    Past Social History   Social History   Socioeconomic History   Marital status: Divorced    Spouse name: Not on file   Number of children: Not on file   Years of education: Not on file   Highest education level: Not on file  Occupational History   Not on file  Tobacco Use   Smoking status: Former    Current packs/day: 0.00    Types: Cigarettes    Quit date: 04/11/2017    Years since quitting: 5.6   Smokeless tobacco: Never  Vaping Use   Vaping status: Never Used  Substance and Sexual Activity   Alcohol use: No    Comment: occaisonally   Drug use:  No   Sexual activity: Not on file  Other Topics Concern   Not on file  Social History Narrative   Not on file   Social Determinants of Health   Financial Resource Strain: Not on file  Food Insecurity: Not on file  Transportation Needs: Not on file  Physical Activity: Not on file  Stress: Not on file  Social Connections: Not on file  Intimate Partner Violence: Not on file    Review of Systems   General: Negative for anorexia, weight loss, fever, chills, fatigue, weakness. Eyes: Negative for vision changes.  ENT: Negative for hoarseness, difficulty swallowing , nasal congestion. CV: Negative for chest pain, angina, palpitations, dyspnea on exertion, peripheral edema.  Respiratory: Negative for dyspnea at rest, dyspnea on exertion, cough, sputum, wheezing.  GI: See history of present illness. GU:  Negative for dysuria, hematuria, urinary incontinence, urinary frequency, nocturnal urination.  MS: Negative for joint pain, low back pain.  Derm: Negative for rash or itching.  Neuro: Negative for weakness, abnormal sensation, seizure, frequent headaches, memory loss,  confusion.  Psych: Negative for anxiety, depression, suicidal ideation, hallucinations.  Endo: Negative for unusual weight change.  Heme: Negative for bruising or bleeding. Allergy: Negative for rash or hives.  Physical Exam   BP 131/69   Pulse 88   Temp 98.6 F (37 C)   Ht 5\' 6"  (1.676 m)   Wt 190 lb 6.4 oz (86.4 kg)   BMI 30.73 kg/m    General: Well-nourished, well-developed in no acute distress.  Head: Normocephalic, atraumatic.   Eyes: Conjunctiva pink, no icterus. Mouth: Oropharyngeal mucosa moist and pink  Neck: Supple without thyromegaly, masses, or lymphadenopathy.  Lungs: Clear to auscultation bilaterally.  Heart: Regular rate and rhythm, no murmurs rubs or gallops.  Abdomen: Bowel sounds are normal, nontender, nondistended, no hepatosplenomegaly or masses,  no abdominal bruits or hernia, no  rebound or guarding.   Rectal: not performed Extremities: No lower extremity edema. No clubbing or deformities.  Neuro: Alert and oriented x 4 , grossly normal neurologically.  Skin: Warm and dry, no rash or jaundice.   Psych: Alert and cooperative, normal mood and affect.  Labs   None available  Imaging Studies   No results found.  Assessment   *Cologuard positive  Will proceed with screening colonoscopy.  PLAN   Colonoscopy. ASA 3.  I have discussed the risks, alternatives, benefits with regards to but not limited to the risk of reaction to medication, bleeding, infection, perforation and the patient is agreeable to proceed. Written consent to be obtained.    Leanna Battles. Melvyn Neth, MHS, PA-C [facility]

## 2022-12-18 NOTE — Patient Instructions (Signed)
We will be in touch to schedule colonoscopy!

## 2022-12-18 NOTE — Telephone Encounter (Signed)
LM[facility]  TCS ASA 3 , Dr.Carver, wants a Monday

## 2022-12-18 NOTE — H&P (View-Only) (Signed)
GI Office Note    Referring Provider: Kirstie Peri, MD Primary Care Physician:  Kirstie Peri, MD  Primary Gastroenterologist: Hennie Duos. Marletta Lor, DO   Chief Complaint   Chief Complaint  Patient presents with   cologuard positive     History of Present Illness   Eugene Harding is a 62 y.o. male presenting today at the request of Dr. Sherryll Burger for further evaluation of positive Cologuard.  Patient has never had a colonoscopy. No FH colon cancer but father/brother/sister have all had colon polyps before age of 72.   He denies constipation, diarrhea, melena, brbpr. No abdominal pain. No heartburn, dysphagia, unintentional weight loss. No N/V.   Medications   Current Outpatient Medications  Medication Sig Dispense Refill   amLODipine (NORVASC) 5 MG tablet Take 5 mg by mouth daily.      aspirin 81 MG chewable tablet Chew 1 tablet (81 mg total) by mouth daily. 30 tablet 0   atorvastatin (LIPITOR) 80 MG tablet Take 0.5 tablets (40 mg total) by mouth daily at 6 PM. 45 tablet 3   empagliflozin (JARDIANCE) 25 MG TABS tablet Take 25 mg by mouth daily.     glimepiride (AMARYL) 2 MG tablet Take 2 mg by mouth daily with breakfast.     lisinopril (PRINIVIL,ZESTRIL) 40 MG tablet Take 40 mg by mouth daily.     meclizine (ANTIVERT) 25 MG tablet Take 25 mg by mouth daily.     metFORMIN (GLUCOPHAGE) 1000 MG tablet Take 1,000 mg by mouth 2 (two) times daily with a meal.      Blood Glucose Monitoring Suppl (ONETOUCH VERIO) w/Device KIT USE TO TEST BLOOD SUGAR  3   glucose blood test strip 1 each by Other route as needed for other. Use as instructed     ONETOUCH DELICA LANCETS FINE MISC USE TO TEST BLOOD SUGAR ONCE DAILY  2   No current facility-administered medications for this visit.    Allergies   Allergies as of 12/18/2022 - Review Complete 02/19/2020  Allergen Reaction Noted   Chlorhexidine Other (See Comments) 02/19/2020    Past Medical History   Past Medical History:  Diagnosis  Date   Bladder cancer [facility])    Essential hypertension    History of stroke    Dizziness and numbness in left hand and leg   Prostate cancer (HCC)    Stroke (HCC)    2018   Type 2 diabetes mellitus (HCC)     Past Surgical History   Past Surgical History:  Procedure Laterality Date   LYMPHADENECTOMY Bilateral 02/19/2020   Procedure: LYMPHADENECTOMY, PELVIC;  Surgeon: Heloise Purpura, MD;  Location: [facility];  Service: Urology;  Laterality: Bilateral;   NECK EXPLORATION  1981   PROSTATE BIOPSY N/A 11/03/2019   Procedure: BIOPSY TRANSRECTAL ULTRASONIC PROSTATE (TUBP);  Surgeon: Jerilee Field, MD;  Location: [facility];  Service: Urology;  Laterality: N/A;   ROBOT ASSISTED LAPAROSCOPIC RADICAL PROSTATECTOMY N/A 02/19/2020   Procedure: XI ROBOTIC ASSISTED LAPAROSCOPIC RADICAL PROSTATECTOMY LEVEL 2;  Surgeon: Heloise Purpura, MD;  Location: [facility];  Service: Urology;  Laterality: N/A;   TRANSURETHRAL RESECTION OF BLADDER TUMOR N/A 11/03/2019   Procedure: TRANSURETHRAL RESECTION OF BLADDER TUMOR (TURBT) 0.5-2.0 cm, POSSIBLE LEFT STENT PLACEMENT;  Surgeon: Jerilee Field, MD;  Location: [facility];  Service: Urology;  Laterality: N/A;    Past Family History   Family History  Problem Relation Age of Onset   Colon polyps Father    Colon polyps Sister  age <60   Heart disease Brother    Colon polyps Brother        age <42    Past Social History   Social History   Socioeconomic History   Marital status: Divorced    Spouse name: Not on file   Number of children: Not on file   Years of education: Not on file   Highest education level: Not on file  Occupational History   Not on file  Tobacco Use   Smoking status: Former    Current packs/day: 0.00    Types: Cigarettes    Quit date: 04/11/2017    Years since quitting: 5.6   Smokeless tobacco: Never  Vaping Use   Vaping status: Never Used  Substance and Sexual Activity   Alcohol use: No    Comment: occaisonally   Drug use:  No   Sexual activity: Not on file  Other Topics Concern   Not on file  Social History Narrative   Not on file   Social Determinants of Health   Financial Resource Strain: Not on file  Food Insecurity: Not on file  Transportation Needs: Not on file  Physical Activity: Not on file  Stress: Not on file  Social Connections: Not on file  Intimate Partner Violence: Not on file    Review of Systems   General: Negative for anorexia, weight loss, fever, chills, fatigue, weakness. Eyes: Negative for vision changes.  ENT: Negative for hoarseness, difficulty swallowing , nasal congestion. CV: Negative for chest pain, angina, palpitations, dyspnea on exertion, peripheral edema.  Respiratory: Negative for dyspnea at rest, dyspnea on exertion, cough, sputum, wheezing.  GI: See history of present illness. GU:  Negative for dysuria, hematuria, urinary incontinence, urinary frequency, nocturnal urination.  MS: Negative for joint pain, low back pain.  Derm: Negative for rash or itching.  Neuro: Negative for weakness, abnormal sensation, seizure, frequent headaches, memory loss,  confusion.  Psych: Negative for anxiety, depression, suicidal ideation, hallucinations.  Endo: Negative for unusual weight change.  Heme: Negative for bruising or bleeding. Allergy: Negative for rash or hives.  Physical Exam   BP 131/69   Pulse 88   Temp 98.6 F (37 C)   Ht 5\' 6"  (1.676 m)   Wt 190 lb 6.4 oz (86.4 kg)   BMI 30.73 kg/m    General: Well-nourished, well-developed in no acute distress.  Head: Normocephalic, atraumatic.   Eyes: Conjunctiva pink, no icterus. Mouth: Oropharyngeal mucosa moist and pink  Neck: Supple without thyromegaly, masses, or lymphadenopathy.  Lungs: Clear to auscultation bilaterally.  Heart: Regular rate and rhythm, no murmurs rubs or gallops.  Abdomen: Bowel sounds are normal, nontender, nondistended, no hepatosplenomegaly or masses,  no abdominal bruits or hernia, no  rebound or guarding.   Rectal: not performed Extremities: No lower extremity edema. No clubbing or deformities.  Neuro: Alert and oriented x 4 , grossly normal neurologically.  Skin: Warm and dry, no rash or jaundice.   Psych: Alert and cooperative, normal mood and affect.  Labs   None available  Imaging Studies   No results found.  Assessment   *Cologuard positive  Will proceed with screening colonoscopy.  PLAN   Colonoscopy. ASA 3.  I have discussed the risks, alternatives, benefits with regards to but not limited to the risk of reaction to medication, bleeding, infection, perforation and the patient is agreeable to proceed. Written consent to be obtained.    Leanna Battles. Melvyn Neth, MHS, PA-C [facility]

## 2022-12-21 ENCOUNTER — Encounter: Payer: Self-pay | Admitting: Gastroenterology

## 2022-12-21 ENCOUNTER — Encounter: Payer: Self-pay | Admitting: *Deleted

## 2022-12-28 NOTE — Patient Instructions (Signed)
Eugene Harding  12/28/2022     @PREFPERIOPPHARMACY @   Your procedure is scheduled on  01/04/2023.   Report to [facility] at  0700  A.M.   Call this number if you have problems the morning of surgery:  [phone]  If you experience any cold or flu symptoms such as cough, fever, chills, shortness of breath, etc. between now and your scheduled surgery, please notify us at the above number.   Remember:  Follow the diet and prep instructions given to you by the office.      Your last dose of jardiance should be on 12/31/2022.       DO NOT take any medications for diabetes the morning of your procedure.     Take these medicines the morning of surgery with A SIP OF WATER                                       amlodipine, antivert(if needed).     Do not wear jewelry, make-up or nail polish, including gel polish,  artificial nails, or any other type of covering on natural nails (fingers and  toes).  Do not wear lotions, powders, or perfumes, or deodorant.  Do not shave 48 hours prior to surgery.  Men may shave face and neck.  Do not bring valuables to the hospital.  [facility] is not responsible for any belongings or valuables.  Contacts, dentures or bridgework may not be worn into surgery.  Leave your suitcase in the car.  After surgery it may be brought to your room.  For patients admitted to the hospital, discharge time will be determined by your treatment team.  Patients discharged the day of surgery will not be allowed to drive home and must have someone with them for 24 hours.    Special instructions:   DO NOT smoke tobacco or vape for 24 hours before your procedure.  Please read over the following fact sheets that you were given. Anesthesia Post-op Instructions and Care and Recovery After Surgery      Colonoscopy, Adult, Care After The following information offers guidance on how to care for yourself after your procedure. Your health care provider may also  give you more specific instructions. If you have problems or questions, contact your health care provider. What can I expect after the procedure? After the procedure, it is common to have: A small amount of blood in your stool for 24 hours after the procedure. Some gas. Mild cramping or bloating of your abdomen. Follow these instructions at home: Eating and drinking  Drink enough fluid to keep your urine pale yellow. Follow instructions from your health care provider about eating or drinking restrictions. Resume your normal diet as told by your health care provider. Avoid heavy or fried foods that are hard to digest. Activity Rest as told by your health care provider. Avoid sitting for a long time without moving. Get up to take short walks every 1-2 hours. This is important to improve blood flow and breathing. Ask for help if you feel weak or unsteady. Return to your normal activities as told by your health care provider. Ask your health care provider what activities are safe for you. Managing cramping and bloating  Try walking around when you have cramps or feel bloated. If directed, apply heat to your abdomen as told by your health care  provider. Use the heat source that your health care provider recommends, such as a moist heat pack or a heating pad. Place a towel between your skin and the heat source. Leave the heat on for 20-30 minutes. Remove the heat if your skin turns bright red. This is especially important if you are unable to feel pain, heat, or cold. You have a greater risk of getting burned. General instructions If you were given a sedative during the procedure, it can affect you for several hours. Do not drive or operate machinery until your health care provider says that it is safe. For the first 24 hours after the procedure: Do not sign important documents. Do not drink alcohol. Do your regular daily activities at a slower pace than normal. Eat soft foods that are easy to  digest. Take over-the-counter and prescription medicines only as told by your health care provider. Keep all follow-up visits. This is important. Contact a health care provider if: You have blood in your stool 2-3 days after the procedure. Get help right away if: You have more than a small spotting of blood in your stool. You have large blood clots in your stool. You have swelling of your abdomen. You have nausea or vomiting. You have a fever. You have increasing pain in your abdomen that is not relieved with medicine. These symptoms may be an emergency. Get help right away. Call 911. Do not wait to see if the symptoms will go away. Do not drive yourself to the hospital. Summary After the procedure, it is common to have a small amount of blood in your stool. You may also have mild cramping and bloating of your abdomen. If you were given a sedative during the procedure, it can affect you for several hours. Do not drive or operate machinery until your health care provider says that it is safe. Get help right away if you have a lot of blood in your stool, nausea or vomiting, a fever, or increased pain in your abdomen. This information is not intended to replace advice given to you by your health care provider. Make sure you discuss any questions you have with your health care provider. Document Revised: 06/30/2022 Document Reviewed: 01/08/2021 Elsevier Patient Education  2024 [employer] Monitored Anesthesia Care, Care After The following information offers guidance on how to care for yourself after your procedure. Your health care provider may also give you more specific instructions. If you have problems or questions, contact your health care provider. What can I expect after the procedure? After the procedure, it is common to have: Tiredness. Little or no memory about what happened during or after the procedure. Impaired judgment when it comes to making decisions. Nausea or  vomiting. Some trouble with balance. Follow these instructions at home: For the time period you were told by your health care provider:  Rest. Do not participate in activities where you could fall or become injured. Do not drive or use machinery. Do not drink alcohol. Do not take sleeping pills or medicines that cause drowsiness. Do not make important decisions or sign legal documents. Do not take care of children on your own. Medicines Take over-the-counter and prescription medicines only as told by your health care provider. If you were prescribed antibiotics, take them as told by your health care provider. Do not stop using the antibiotic even if you start to feel better. Eating and drinking Follow instructions from your health care provider about what you may eat and drink. Drink  enough fluid to keep your urine pale yellow. If you vomit: Drink clear fluids slowly and in small amounts as you are able. Clear fluids include water, ice chips, low-calorie sports drinks, and fruit juice that has water added to it (diluted fruit juice). Eat light and bland foods in small amounts as you are able. These foods include bananas, applesauce, rice, lean meats, toast, and crackers. General instructions  Have a responsible adult stay with you for the time you are told. It is important to have someone help care for you until you are awake and alert. If you have sleep apnea, surgery and some medicines can increase your risk for breathing problems. Follow instructions from your health care provider about wearing your sleep device: When you are sleeping. This includes during daytime naps. While taking prescription pain medicines, sleeping medicines, or medicines that make you drowsy. Do not use any products that contain nicotine or tobacco. These products include cigarettes, chewing tobacco, and vaping devices, such as e-cigarettes. If you need help quitting, ask your health care provider. Contact a  health care provider if: You feel nauseous or vomit every time you eat or drink. You feel light-headed. You are still sleepy or having trouble with balance after 24 hours. You get a rash. You have a fever. You have redness or swelling around the IV site. Get help right away if: You have trouble breathing. You have new confusion after you get home. These symptoms may be an emergency. Get help right away. Call 911. Do not wait to see if the symptoms will go away. Do not drive yourself to the hospital. This information is not intended to replace advice given to you by your health care provider. Make sure you discuss any questions you have with your health care provider. Document Revised: 10/13/2021 Document Reviewed: 10/13/2021 Elsevier Patient Education  2024 [employer].

## 2022-12-30 ENCOUNTER — Encounter (HOSPITAL_COMMUNITY): Payer: Self-pay

## 2022-12-30 ENCOUNTER — Encounter (HOSPITAL_COMMUNITY)
Admission: RE | Admit: 2022-12-30 | Discharge: 2022-12-30 | Disposition: A | Discharge status: Home / Self Care | Payer: Medicare Other | Source: Ambulatory Visit | Attending: Internal Medicine | Admitting: Internal Medicine

## 2022-12-30 ENCOUNTER — Other Ambulatory Visit: Payer: Self-pay

## 2022-12-30 VITALS — BP 131/69 | HR 88 | Temp 98.6°F | Resp 18 | Ht 66.0 in | Wt 190.4 lb

## 2022-12-30 DIAGNOSIS — Z720 Tobacco use: Secondary | ICD-10-CM

## 2022-12-30 DIAGNOSIS — E1159 Type 2 diabetes mellitus with other circulatory complications: Secondary | ICD-10-CM | POA: Insufficient documentation

## 2022-12-30 DIAGNOSIS — Z01818 Encounter for other preprocedural examination: Secondary | ICD-10-CM | POA: Insufficient documentation

## 2022-12-30 DIAGNOSIS — I10 Essential (primary) hypertension: Secondary | ICD-10-CM | POA: Diagnosis not present

## 2022-12-30 DIAGNOSIS — F17200 Nicotine dependence, unspecified, uncomplicated: Secondary | ICD-10-CM | POA: Diagnosis not present

## 2022-12-30 DIAGNOSIS — D696 Thrombocytopenia, unspecified: Secondary | ICD-10-CM

## 2022-12-30 LAB — BASIC METABOLIC PANEL
Anion gap: 14 (ref 5–15)
BUN: 15 mg/dL (ref 8–23)
CO2: 23 mmol/L (ref 22–32)
Calcium: 9.7 mg/dL (ref 8.9–10.3)
Chloride: 101 mmol/L (ref 98–111)
Creatinine, Ser: 1.03 mg/dL (ref 0.61–1.24)
GFR, Estimated: >60 mL/min (ref >60)
Glucose, Bld: 186 mg/dL — ABNORMAL HIGH (ref 70–99)
Potassium: 4.3 mmol/L (ref 3.5–5.1)
Sodium: 138 mmol/L (ref 135–145)

## 2022-12-30 LAB — CBC WITH DIFFERENTIAL/PLATELET
Abs Immature Granulocytes: 0.12 10*3/uL — ABNORMAL HIGH (ref 0.00–0.07)
Basophils Absolute: 0.1 10*3/uL (ref 0.0–0.1)
Basophils Relative: 1 %
Eosinophils Absolute: 0.3 10*3/uL (ref 0.0–0.5)
Eosinophils Relative: 4 %
HCT: 49.8 % (ref 39.0–52.0)
Hemoglobin: 15.9 g/dL (ref 13.0–17.0)
Immature Granulocytes: 1 %
Lymphocytes Relative: 20 %
Lymphs Abs: 1.8 10*3/uL (ref 0.7–4.0)
MCH: 30.7 pg (ref 26.0–34.0)
MCHC: 31.9 g/dL (ref 30.0–36.0)
MCV: 96.1 fL (ref 80.0–100.0)
Monocytes Absolute: 0.7 10*3/uL (ref 0.1–1.0)
Monocytes Relative: 8 %
Neutro Abs: 5.9 10*3/uL (ref 1.7–7.7)
Neutrophils Relative %: 66 %
Platelets: 121 10*3/uL — ABNORMAL LOW (ref 150–400)
RBC: 5.18 MIL/uL (ref 4.22–5.81)
RDW: 13.7 % (ref 11.5–15.5)
WBC: 9 10*3/uL (ref 4.0–10.5)
nRBC: 0 % (ref 0.0–0.2)

## 2023-01-04 ENCOUNTER — Encounter (HOSPITAL_COMMUNITY)
Admission: RE | Disposition: A | Discharge status: Home / Self Care | Payer: Self-pay | Source: Home / Self Care | Attending: Internal Medicine

## 2023-01-04 ENCOUNTER — Ambulatory Visit (HOSPITAL_BASED_OUTPATIENT_CLINIC_OR_DEPARTMENT_OTHER): Payer: Medicare Other | Admitting: Anesthesiology

## 2023-01-04 ENCOUNTER — Ambulatory Visit (HOSPITAL_COMMUNITY): Payer: Medicare Other | Admitting: Anesthesiology

## 2023-01-04 ENCOUNTER — Encounter (HOSPITAL_COMMUNITY): Payer: Self-pay

## 2023-01-04 ENCOUNTER — Ambulatory Visit (HOSPITAL_COMMUNITY)
Admission: RE | Admit: 2023-01-04 | Discharge: 2023-01-04 | Disposition: A | Discharge status: Home / Self Care | Payer: Medicare Other | Attending: Internal Medicine | Admitting: Internal Medicine

## 2023-01-04 DIAGNOSIS — R195 Other fecal abnormalities: Secondary | ICD-10-CM | POA: Insufficient documentation

## 2023-01-04 DIAGNOSIS — E119 Type 2 diabetes mellitus without complications: Secondary | ICD-10-CM | POA: Insufficient documentation

## 2023-01-04 DIAGNOSIS — Z8673 Personal history of transient ischemic attack (TIA), and cerebral infarction without residual deficits: Secondary | ICD-10-CM | POA: Insufficient documentation

## 2023-01-04 DIAGNOSIS — I10 Essential (primary) hypertension: Secondary | ICD-10-CM | POA: Diagnosis not present

## 2023-01-04 DIAGNOSIS — Z79899 Other long term (current) drug therapy: Secondary | ICD-10-CM | POA: Diagnosis not present

## 2023-01-04 DIAGNOSIS — Z8546 Personal history of malignant neoplasm of prostate: Secondary | ICD-10-CM | POA: Insufficient documentation

## 2023-01-04 DIAGNOSIS — Z8249 Family history of ischemic heart disease and other diseases of the circulatory system: Secondary | ICD-10-CM | POA: Insufficient documentation

## 2023-01-04 DIAGNOSIS — Z87891 Personal history of nicotine dependence: Secondary | ICD-10-CM | POA: Diagnosis not present

## 2023-01-04 DIAGNOSIS — D123 Benign neoplasm of transverse colon: Secondary | ICD-10-CM | POA: Diagnosis not present

## 2023-01-04 DIAGNOSIS — Z1211 Encounter for screening for malignant neoplasm of colon: Secondary | ICD-10-CM | POA: Insufficient documentation

## 2023-01-04 DIAGNOSIS — D122 Benign neoplasm of ascending colon: Secondary | ICD-10-CM | POA: Diagnosis not present

## 2023-01-04 DIAGNOSIS — D126 Benign neoplasm of colon, unspecified: Secondary | ICD-10-CM

## 2023-01-04 DIAGNOSIS — D124 Benign neoplasm of descending colon: Secondary | ICD-10-CM | POA: Insufficient documentation

## 2023-01-04 DIAGNOSIS — Z139 Encounter for screening, unspecified: Secondary | ICD-10-CM | POA: Diagnosis not present

## 2023-01-04 DIAGNOSIS — Z7984 Long term (current) use of oral hypoglycemic drugs: Secondary | ICD-10-CM | POA: Insufficient documentation

## 2023-01-04 DIAGNOSIS — I6930 Unspecified sequelae of cerebral infarction: Secondary | ICD-10-CM | POA: Diagnosis not present

## 2023-01-04 DIAGNOSIS — Z83719 Family history of colon polyps, unspecified: Secondary | ICD-10-CM | POA: Insufficient documentation

## 2023-01-04 DIAGNOSIS — K635 Polyp of colon: Secondary | ICD-10-CM | POA: Diagnosis not present

## 2023-01-04 HISTORY — PX: POLYPECTOMY: SHX149

## 2023-01-04 HISTORY — PX: COLONOSCOPY WITH PROPOFOL: SHX5780

## 2023-01-04 LAB — GLUCOSE, CAPILLARY: Glucose-Capillary: 102 mg/dL — ABNORMAL HIGH (ref 70–99)

## 2023-01-04 SURGERY — COLONOSCOPY WITH PROPOFOL
Anesthesia: General

## 2023-01-04 MED ORDER — LIDOCAINE HCL (CARDIAC) PF 100 MG/5ML IV SOSY
PREFILLED_SYRINGE | INTRAVENOUS | Status: DC | PRN
  Administered 2023-01-04: 50 mg via INTRAVENOUS

## 2023-01-04 MED ORDER — LACTATED RINGERS IV SOLN: INTRAVENOUS | Status: DC

## 2023-01-04 MED ORDER — STERILE WATER FOR IRRIGATION IR SOLN
Status: DC | PRN
  Administered 2023-01-04: 60 mL

## 2023-01-04 MED ORDER — PROPOFOL 10 MG/ML IV BOLUS
INTRAVENOUS | Status: DC | PRN
Start: 2023-01-04 — End: 2023-01-04
  Administered 2023-01-04: 40 mg via INTRAVENOUS
  Administered 2023-01-04: 120 mg via INTRAVENOUS
  Administered 2023-01-04: 50 mg via INTRAVENOUS
  Administered 2023-01-04 (×3): 30 mg via INTRAVENOUS

## 2023-01-04 NOTE — Anesthesia Procedure Notes (Signed)
Date/Time: 01/04/2023 8:44 AM  Performed by: Julian Reil, CRNAPre-anesthesia Checklist: Patient identified, Emergency Drugs available, Suction available and Patient being monitored Patient Re-evaluated:Patient Re-evaluated prior to induction Oxygen Delivery Method: Nasal cannula Induction Type: IV induction Placement Confirmation: positive ETCO2

## 2023-01-04 NOTE — Discharge Instructions (Addendum)
°  Colonoscopy °Discharge Instructions ° °Read the instructions outlined below and refer to this sheet in the next few weeks. These discharge instructions provide you with general information on caring for yourself after you leave the hospital. Your doctor may also give you specific instructions. While your treatment has been planned according to the most current medical practices available, unavoidable complications occasionally occur.  ° °ACTIVITY °You may resume your regular activity, but move at a slower pace for the next 24 hours.  °Take frequent rest periods for the next 24 hours.  °Walking will help get rid of the air and reduce the bloated feeling in your belly (abdomen).  °No driving for 24 hours (because of the medicine (anesthesia) used during the test).   °Do not sign any important legal documents or operate any machinery for 24 hours (because of the anesthesia used during the test).  °NUTRITION °Drink plenty of fluids.  °You may resume your normal diet as instructed by your doctor.  °Begin with a light meal and progress to your normal diet. Heavy or fried foods are harder to digest and may make you feel sick to your stomach (nauseated).  °Avoid alcoholic beverages for 24 hours or as instructed.  °MEDICATIONS °You may resume your normal medications unless your doctor tells you otherwise.  °WHAT YOU CAN EXPECT TODAY °Some feelings of bloating in the abdomen.  °Passage of more gas than usual.  °Spotting of blood in your stool or on the toilet paper.  °IF YOU HAD POLYPS REMOVED DURING THE COLONOSCOPY: °No aspirin products for 7 days or as instructed.  °No alcohol for 7 days or as instructed.  °Eat a soft diet for the next 24 hours.  °FINDING OUT THE RESULTS OF YOUR TEST °Not all test results are available during your visit. If your test results are not back during the visit, make an appointment with your caregiver to find out the results. Do not assume everything is normal if you have not heard from your  caregiver or the medical facility. It is important for you to follow up on all of your test results.  °SEEK IMMEDIATE MEDICAL ATTENTION IF: °You have more than a spotting of blood in your stool.  °Your belly is swollen (abdominal distention).  °You are nauseated or vomiting.  °You have a temperature over 101.  °You have abdominal pain or discomfort that is severe or gets worse throughout the day.  ° °Your colonoscopy revealed 6 polyp(s) which I removed successfully. Await pathology results, my office will contact you. I recommend repeating colonoscopy in 3 years for surveillance purposes. Otherwise follow up with GI as needed.  ° ° °I hope you have a great rest of your week! ° °Charles K. Carver, D.O. °Gastroenterology and Hepatology °[facility] ° °

## 2023-01-04 NOTE — Interval H&P Note (Signed)
History and Physical Interval Note:  01/04/2023 8:33 AM  Eugene Harding  has presented today for surgery, with the diagnosis of screening.  The various methods of treatment have been discussed with the patient and family. After consideration of risks, benefits and other options for treatment, the patient has consented to  Procedure(s) with comments: COLONOSCOPY WITH PROPOFOL (N/A) - 9:15 am, asa 3 as a surgical intervention.  The patient's history has been reviewed, patient examined, no change in status, stable for surgery.  I have reviewed the patient's chart and labs.  Questions were answered to the patient's satisfaction.     Lanelle Bal

## 2023-01-04 NOTE — Transfer of Care (Signed)
Immediate Anesthesia Transfer of Care Note  Patient: Eugene Harding  Procedure(s) Performed: COLONOSCOPY WITH PROPOFOL POLYPECTOMY INTESTINAL  Patient Location: Short Stay  Anesthesia Type:General  Level of Consciousness: awake  Airway & Oxygen Therapy: Patient Spontanous Breathing  Post-op Assessment: Report given to RN and Post -op Vital signs reviewed and stable  Post vital signs: Reviewed and stable  Last Vitals:  Vitals Value Taken Time  BP    Temp    Pulse    Resp    SpO2      Last Pain:  Vitals:   01/04/23 0842  TempSrc:   PainSc: 0-No pain         Complications: No notable events documented.

## 2023-01-04 NOTE — Op Note (Addendum)
[facility] Patient Name: Eugene Harding Procedure Date: 01/04/2023 8:35 AM MRN: [id number] Date of Birth: 12-26-1960 Attending MD: Hennie Duos. Marletta Lor , [place], [id number] CSN: [id number] Age: 62 Admit Type: Outpatient Procedure:                Colonoscopy Indications:              Screening for colorectal malignant neoplasm,                            Incidental - Positive Cologuard test Providers:                Hennie Duos. Marletta Lor, DO, Crystal Page, Pandora Leiter                            Tech., Technician Referring MD:              Medicines:                See the Anesthesia note for documentation of the                            administered medications Complications:            No immediate complications. Estimated Blood Loss:     Estimated blood loss was minimal. Procedure:                Pre-Anesthesia Assessment:                           - The anesthesia plan was to use monitored                            anesthesia care (MAC).                           After obtaining informed consent, the colonoscope                            was passed under direct vision. Throughout the                            procedure, the patient's blood pressure, pulse, and                            oxygen saturations were monitored continuously. The                            [id number]-HQ190L ([id number]) scope was introduced through                            the anus and advanced to the the cecum, identified                            by appendiceal orifice and ileocecal valve. The                            colonoscopy was performed without difficulty. The  patient tolerated the procedure well. The quality                            of the bowel preparation was evaluated using the                            BBPS [place] Bowel Preparation Scale) with scores                            of: Right Colon = 2 (minor amount of residual                            staining, small  fragments of stool and/or opaque                            liquid, but mucosa seen well), Transverse Colon = 3                            (entire mucosa seen well with no residual staining,                            small fragments of stool or opaque liquid) and Left                            Colon = 3 (entire mucosa seen well with no residual                            staining, small fragments of stool or opaque                            liquid). The total BBPS score equals 8. The quality                            of the bowel preparation was good. Scope In: 8:45:16 AM Scope Out: 9:08:44 AM Scope Withdrawal Time: 0 hours 21 minutes 13 seconds  Total Procedure Duration: 0 hours 23 minutes 28 seconds  Findings:      A 3 mm polyp was found in the ascending colon. The polyp was sessile.       The polyp was removed with a cold snare. Resection and retrieval were       complete.      Five sessile polyps were found in the descending colon and transverse       colon. The polyps were 5 to 8 mm in size. These polyps were removed with       a cold snare. Resection and retrieval were complete.      The exam was otherwise without abnormality. Impression:               - One 3 mm polyp in the ascending colon, removed                            with a cold snare. Resected and retrieved.                           -  Five 5 to 8 mm polyps in the descending colon and                            in the transverse colon, removed with a cold snare.                            Resected and retrieved.                           - The examination was otherwise normal. Moderate Sedation:      Per Anesthesia Care Recommendation:           - Patient has a contact number available for                            emergencies. The signs and symptoms of potential                            delayed complications were discussed with the                            patient. Return to normal activities tomorrow.                             Written discharge instructions were provided to the                            patient.                           - Resume previous diet.                           - Continue present medications.                           - Await pathology results.                           - Repeat colonoscopy in 3 years for surveillance.                           - Return to GI clinic PRN. Procedure Code(s):        --- Professional ---                           [phone], Colonoscopy, flexible; with removal of                            tumor(s), polyp(s), or other lesion(s) by snare                            technique Diagnosis Code(s):        --- Professional ---  Z12.11, Encounter for screening for malignant                            neoplasm of colon                           D12.2, Benign neoplasm of ascending colon                           D12.4, Benign neoplasm of descending colon                           D12.3, Benign neoplasm of transverse colon (hepatic                            flexure or splenic flexure) CPT copyright 2022 American Medical Association. All rights reserved. The codes documented in this report are preliminary and upon coder review may  be revised to meet current compliance requirements. Hennie Duos. Marletta Lor, DO Hennie Duos. Marletta Lor, DO 01/04/2023 9:13:34 AM This report has been signed electronically. Number of Addenda: 0

## 2023-01-04 NOTE — Anesthesia Preprocedure Evaluation (Signed)
Anesthesia Evaluation  Patient identified by MRN, date of birth, ID band Patient awake    Reviewed: Allergy & Precautions, H&P , NPO status , Patient's Chart, lab work & pertinent test results  Airway Mallampati: III  TM Distance: >3 FB Neck ROM: Full    Dental  (+) Dental Advisory Given, Teeth Intact   Pulmonary neg pulmonary ROS, former smoker   Pulmonary exam normal breath sounds clear to auscultation       Cardiovascular Exercise Tolerance: Good hypertension, Pt. on medications Normal cardiovascular exam Rhythm:Regular Rate:Normal     Neuro/Psych CVA, Residual Symptoms  negative psych ROS   GI/Hepatic negative GI ROS, Neg liver ROS,,,  Endo/Other  diabetes, Well Controlled, Type 2, Oral Hypoglycemic Agents    Renal/GU negative Renal ROS Bladder dysfunction (prostate cancer)      Musculoskeletal negative musculoskeletal ROS (+)    Abdominal   Peds negative pediatric ROS (+)  Hematology negative hematology ROS (+)   Anesthesia Other Findings   Reproductive/Obstetrics negative OB ROS                             Anesthesia Physical Anesthesia Plan  ASA: 3  Anesthesia Plan: General   Post-op Pain Management: Minimal or no pain anticipated   Induction: Intravenous  PONV Risk Score and Plan: 1 and Propofol infusion  Airway Management Planned: Nasal Cannula and Natural Airway  Additional Equipment:   Intra-op Plan:   Post-operative Plan:   Informed Consent: I have reviewed the patients History and Physical, chart, labs and discussed the procedure including the risks, benefits and alternatives for the proposed anesthesia with the patient or authorized representative who has indicated his/her understanding and acceptance.     Dental advisory given  Plan Discussed with: CRNA and Surgeon  Anesthesia Plan Comments:        Anesthesia Quick Evaluation

## 2023-01-04 NOTE — Anesthesia Postprocedure Evaluation (Signed)
Anesthesia Post Note  Patient: Eugene Harding  Procedure(s) Performed: COLONOSCOPY WITH PROPOFOL POLYPECTOMY INTESTINAL  Patient location during evaluation: Phase II Anesthesia Type: General Level of consciousness: awake and alert and oriented Pain management: pain level controlled Vital Signs Assessment: post-procedure vital signs reviewed and stable Respiratory status: spontaneous breathing, nonlabored ventilation and respiratory function stable Cardiovascular status: blood pressure returned to baseline and stable Postop Assessment: no apparent nausea or vomiting Anesthetic complications: no  No notable events documented.   Last Vitals:  Vitals:   01/04/23 0912 01/04/23 0915  BP: (!) 93/44 135/64  Pulse: 73   Resp: 15   Temp: 36.6 C   SpO2: (!) 15%     Last Pain:  Vitals:   01/04/23 0912  TempSrc: Oral  PainSc: 0-No pain                  C 

## 2023-01-07 ENCOUNTER — Encounter (HOSPITAL_COMMUNITY): Payer: Self-pay | Admitting: Internal Medicine

## 2023-02-26 DIAGNOSIS — I69359 Hemiplegia and hemiparesis following cerebral infarction affecting unspecified side: Secondary | ICD-10-CM | POA: Diagnosis not present

## 2023-02-26 DIAGNOSIS — Z299 Encounter for prophylactic measures, unspecified: Secondary | ICD-10-CM | POA: Diagnosis not present

## 2023-02-26 DIAGNOSIS — C642 Malignant neoplasm of left kidney, except renal pelvis: Secondary | ICD-10-CM | POA: Diagnosis not present

## 2023-02-26 DIAGNOSIS — I10 Essential (primary) hypertension: Secondary | ICD-10-CM | POA: Diagnosis not present

## 2023-02-26 DIAGNOSIS — E1165 Type 2 diabetes mellitus with hyperglycemia: Secondary | ICD-10-CM | POA: Diagnosis not present

## 2023-04-12 DIAGNOSIS — Z79899 Other long term (current) drug therapy: Secondary | ICD-10-CM | POA: Diagnosis not present

## 2023-04-12 DIAGNOSIS — Z299 Encounter for prophylactic measures, unspecified: Secondary | ICD-10-CM | POA: Diagnosis not present

## 2023-04-12 DIAGNOSIS — R5383 Other fatigue: Secondary | ICD-10-CM | POA: Diagnosis not present

## 2023-04-12 DIAGNOSIS — Z0000 Encounter for general adult medical examination without abnormal findings: Secondary | ICD-10-CM | POA: Diagnosis not present

## 2023-04-12 DIAGNOSIS — K7460 Unspecified cirrhosis of liver: Secondary | ICD-10-CM | POA: Diagnosis not present

## 2023-04-12 DIAGNOSIS — Z7189 Other specified counseling: Secondary | ICD-10-CM | POA: Diagnosis not present

## 2023-04-12 DIAGNOSIS — E7800 Pure hypercholesterolemia, unspecified: Secondary | ICD-10-CM | POA: Diagnosis not present

## 2023-04-12 DIAGNOSIS — I10 Essential (primary) hypertension: Secondary | ICD-10-CM | POA: Diagnosis not present

## 2023-05-17 DIAGNOSIS — H2513 Age-related nuclear cataract, bilateral: Secondary | ICD-10-CM | POA: Diagnosis not present

## 2023-05-17 DIAGNOSIS — E119 Type 2 diabetes mellitus without complications: Secondary | ICD-10-CM | POA: Diagnosis not present

## 2023-05-17 DIAGNOSIS — Z7984 Long term (current) use of oral hypoglycemic drugs: Secondary | ICD-10-CM | POA: Diagnosis not present

## 2023-05-17 DIAGNOSIS — H40013 Open angle with borderline findings, low risk, bilateral: Secondary | ICD-10-CM | POA: Diagnosis not present

## 2023-06-03 DIAGNOSIS — I700 Atherosclerosis of aorta: Secondary | ICD-10-CM | POA: Diagnosis not present

## 2023-06-03 DIAGNOSIS — E1169 Type 2 diabetes mellitus with other specified complication: Secondary | ICD-10-CM | POA: Diagnosis not present

## 2023-06-03 DIAGNOSIS — I10 Essential (primary) hypertension: Secondary | ICD-10-CM | POA: Diagnosis not present

## 2023-06-03 DIAGNOSIS — Z299 Encounter for prophylactic measures, unspecified: Secondary | ICD-10-CM | POA: Diagnosis not present

## 2023-06-07 DIAGNOSIS — H25813 Combined forms of age-related cataract, bilateral: Secondary | ICD-10-CM | POA: Diagnosis not present

## 2023-06-07 DIAGNOSIS — H401134 Primary open-angle glaucoma, bilateral, indeterminate stage: Secondary | ICD-10-CM | POA: Diagnosis not present

## 2023-06-07 DIAGNOSIS — H02831 Dermatochalasis of right upper eyelid: Secondary | ICD-10-CM | POA: Diagnosis not present

## 2023-06-07 DIAGNOSIS — H02834 Dermatochalasis of left upper eyelid: Secondary | ICD-10-CM | POA: Diagnosis not present

## 2023-06-18 DIAGNOSIS — D4102 Neoplasm of uncertain behavior of left kidney: Secondary | ICD-10-CM | POA: Diagnosis not present

## 2023-06-18 DIAGNOSIS — N2889 Other specified disorders of kidney and ureter: Secondary | ICD-10-CM | POA: Diagnosis not present

## 2023-06-21 DIAGNOSIS — H25812 Combined forms of age-related cataract, left eye: Secondary | ICD-10-CM | POA: Diagnosis not present

## 2023-06-23 ENCOUNTER — Encounter (HOSPITAL_COMMUNITY)
Admission: RE | Admit: 2023-06-23 | Discharge: 2023-06-23 | Disposition: A | Discharge status: Home / Self Care | Payer: Medicare Other | Source: Ambulatory Visit | Attending: Ophthalmology | Admitting: Ophthalmology

## 2023-06-23 ENCOUNTER — Other Ambulatory Visit: Payer: Self-pay

## 2023-06-23 ENCOUNTER — Encounter (HOSPITAL_COMMUNITY): Payer: Self-pay

## 2023-06-23 NOTE — H&P (Signed)
Surgical History & Physical  Patient Name: Eugene Harding  DOB: 07/27/1960  Surgery: Cataract extraction with intraocular lens implant phacoemulsification; Left Eye Surgeon: Fabio Pierce MD Surgery Date: 06/28/2023 Pre-Op Date: 06/07/2023  HPI: A 57 Yr. old male patient present for cataract eval per Dr. Daphine Deutscher.1. The patient complains of difficulty when viewing TV, reading closed caption, news scrolls on TV, which began for an unknown amount of time because of a gradual change. Both eyes are affected. This is negatively affecting the patient's quality of life and the patient is unable to function adequately in life with the current level of vision.His left eye is weaker after having a stroke 7-8 years ago. HPI Completed by Dr. Fabio Pierce  Medical History: Glaucoma Cataracts  Cancer Diabetes High Blood Pressure LDL Stroke  Review of Systems Cardiovascular High Blood Pressure Endocrine diabetes Neurological Stroke All recorded systems are negative except as noted above.  Social Former smoker   Medication atorvastatin ,  amlodipine ,  metformin ,  neomycin-polymyxin B-dexameth ,  lisinopril ,  glimepiride ,  meclizine ,  Jardiance ,  aspirin ,  prednisolone acetate ,  Ilevro ,  moxifloxacin   Sx/Procedures Biopsy of prostate  Drug Allergies  NKDA  History & Physical: Heent: cataracts NECK: supple without bruits LUNGS: lungs clear to auscultation CV: regular rate and rhythm Abdomen: soft and non-tender  Impression & Plan: Assessment: 1.  COMBINED FORMS AGE RELATED CATARACT; Both Eyes (H25.813) 2.  PRIMARY OPEN ANGLE GLAUCOMA; Both Eyes Indeterminate (H40.1134) 3.  DERMATOCHALASIS, no surgery; Right Upper Lid, Left Upper Lid (H02.831, H02.834) 4.  BLEPHARITIS; Right Upper Lid, Right Lower Lid, Left Upper Lid, Left Lower Lid (H01.001, H01.002,H01.004,H01.005) 5.  Pinguecula; Both Eyes (H11.153)  Plan: 1.  Cataract accounts for the patient's decreased vision. This visual  impairment is not correctable with a tolerable change in glasses or contact lenses. Cataract surgery with an implantation of a new lens should significantly improve the visual and functional status of the patient. Discussed all risks, benefits, alternatives, and potential complications. Discussed the procedures and recovery. Patient desires to have surgery. A-scan ordered and performed today for intra-ocular lens calculations. The surgery will be performed in order to improve vision for driving, reading, and for eye examinations. Recommend phacoemulsification with intra-ocular lens. Recommend Dextenza for post-operative pain and inflammation. Left Eye non-dominant - first.. Dilates well - shugarcaine by protocol.  2.  Based on cup-to-disc ratio. Negative Family history. IOPs WNL OU today. Recommend cataract surgery to help lower IOP and allow for better assessment of the glaucoma. OCT rNFL shows: thinning 06/07/23  3.  Asymptomatic, recommend observation for now. Findings, prognosis and treatment options reviewed.  4.  Blepharitis is present - recommend regular lid cleaning.  5.  Observe; Artificial tears as needed for irritation.

## 2023-06-28 ENCOUNTER — Encounter (HOSPITAL_COMMUNITY)
Admission: RE | Disposition: A | Discharge status: Home / Self Care | Payer: Self-pay | Source: Home / Self Care | Attending: Ophthalmology

## 2023-06-28 ENCOUNTER — Ambulatory Visit (HOSPITAL_COMMUNITY)
Admission: RE | Admit: 2023-06-28 | Discharge: 2023-06-28 | Disposition: A | Discharge status: Home / Self Care | Payer: Medicare Other | Attending: Ophthalmology | Admitting: Ophthalmology

## 2023-06-28 ENCOUNTER — Ambulatory Visit (HOSPITAL_COMMUNITY): Payer: Medicare Other | Admitting: Anesthesiology

## 2023-06-28 ENCOUNTER — Encounter (HOSPITAL_COMMUNITY): Payer: Self-pay | Admitting: Ophthalmology

## 2023-06-28 ENCOUNTER — Other Ambulatory Visit: Payer: Self-pay

## 2023-06-28 DIAGNOSIS — H02834 Dermatochalasis of left upper eyelid: Secondary | ICD-10-CM | POA: Insufficient documentation

## 2023-06-28 DIAGNOSIS — H401124 Primary open-angle glaucoma, left eye, indeterminate stage: Secondary | ICD-10-CM | POA: Diagnosis not present

## 2023-06-28 DIAGNOSIS — H0100B Unspecified blepharitis left eye, upper and lower eyelids: Secondary | ICD-10-CM | POA: Insufficient documentation

## 2023-06-28 DIAGNOSIS — Z87891 Personal history of nicotine dependence: Secondary | ICD-10-CM | POA: Diagnosis not present

## 2023-06-28 DIAGNOSIS — H11153 Pinguecula, bilateral: Secondary | ICD-10-CM | POA: Insufficient documentation

## 2023-06-28 DIAGNOSIS — H02831 Dermatochalasis of right upper eyelid: Secondary | ICD-10-CM | POA: Insufficient documentation

## 2023-06-28 DIAGNOSIS — H401114 Primary open-angle glaucoma, right eye, indeterminate stage: Secondary | ICD-10-CM | POA: Diagnosis not present

## 2023-06-28 DIAGNOSIS — H0100A Unspecified blepharitis right eye, upper and lower eyelids: Secondary | ICD-10-CM | POA: Diagnosis not present

## 2023-06-28 DIAGNOSIS — I10 Essential (primary) hypertension: Secondary | ICD-10-CM | POA: Insufficient documentation

## 2023-06-28 DIAGNOSIS — Z8673 Personal history of transient ischemic attack (TIA), and cerebral infarction without residual deficits: Secondary | ICD-10-CM | POA: Insufficient documentation

## 2023-06-28 DIAGNOSIS — H25812 Combined forms of age-related cataract, left eye: Secondary | ICD-10-CM | POA: Diagnosis not present

## 2023-06-28 DIAGNOSIS — Z7984 Long term (current) use of oral hypoglycemic drugs: Secondary | ICD-10-CM | POA: Diagnosis not present

## 2023-06-28 DIAGNOSIS — E1136 Type 2 diabetes mellitus with diabetic cataract: Secondary | ICD-10-CM | POA: Insufficient documentation

## 2023-06-28 HISTORY — PX: CATARACT EXTRACTION W/PHACO: SHX586

## 2023-06-28 LAB — GLUCOSE, CAPILLARY: Glucose-Capillary: 249 mg/dL — ABNORMAL HIGH (ref 70–99)

## 2023-06-28 SURGERY — PHACOEMULSIFICATION, CATARACT, WITH IOL INSERTION
Anesthesia: Monitor Anesthesia Care | Site: Eye | Laterality: Left

## 2023-06-28 MED ORDER — TROPICAMIDE 1 % OP SOLN
1.0000 [drp] | OPHTHALMIC | Status: AC | PRN
  Administered 2023-06-28 (×3): 1 [drp] via OPHTHALMIC

## 2023-06-28 MED ORDER — STERILE WATER FOR IRRIGATION IR SOLN
Status: DC | PRN
  Administered 2023-06-28: 1

## 2023-06-28 MED ORDER — TETRACAINE HCL 0.5 % OP SOLN
1.0000 [drp] | OPHTHALMIC | Status: AC | PRN
  Administered 2023-06-28 (×3): 1 [drp] via OPHTHALMIC

## 2023-06-28 MED ORDER — SODIUM HYALURONATE 10 MG/ML IO SOLUTION
PREFILLED_SYRINGE | INTRAOCULAR | Status: DC | PRN
  Administered 2023-06-28: .85 mL via INTRAOCULAR

## 2023-06-28 MED ORDER — LIDOCAINE HCL 3.5 % OP GEL
1.0000 | Freq: Once | OPHTHALMIC | Status: AC
  Administered 2023-06-28: 1 via OPHTHALMIC

## 2023-06-28 MED ORDER — SODIUM HYALURONATE 23MG/ML IO SOSY
PREFILLED_SYRINGE | INTRAOCULAR | Status: DC | PRN
  Administered 2023-06-28: .6 mL via INTRAOCULAR

## 2023-06-28 MED ORDER — MOXIFLOXACIN HCL 5 MG/ML IO SOLN
INTRAOCULAR | Status: DC | PRN
  Administered 2023-06-28: .3 mL via INTRACAMERAL

## 2023-06-28 MED ORDER — MIDAZOLAM HCL 5 MG/5ML IJ SOLN
INTRAMUSCULAR | Status: DC | PRN
  Administered 2023-06-28: 2 mg via INTRAVENOUS

## 2023-06-28 MED ORDER — SODIUM CHLORIDE 0.9% FLUSH
INTRAVENOUS | Status: DC | PRN
  Administered 2023-06-28: 10 mL via INTRAVENOUS

## 2023-06-28 MED ORDER — LIDOCAINE HCL (PF) 1 % IJ SOLN
INTRAOCULAR | Status: DC | PRN
  Administered 2023-06-28: 1 mL via OPHTHALMIC

## 2023-06-28 MED ORDER — BSS IO SOLN
INTRAOCULAR | Status: DC | PRN
  Administered 2023-06-28: 15 mL via INTRAOCULAR

## 2023-06-28 MED ORDER — PHENYLEPHRINE HCL 2.5 % OP SOLN
1.0000 [drp] | OPHTHALMIC | Status: AC | PRN
  Administered 2023-06-28 (×3): 1 [drp] via OPHTHALMIC

## 2023-06-28 MED ORDER — EPINEPHRINE PF 1 MG/ML IJ SOLN
INTRAOCULAR | Status: DC | PRN
  Administered 2023-06-28: 500 mL

## 2023-06-28 MED ORDER — MIDAZOLAM HCL 2 MG/2ML IJ SOLN
INTRAMUSCULAR | Status: AC
  Filled 2023-06-28: qty 2

## 2023-06-28 MED ORDER — POVIDONE-IODINE 5 % OP SOLN
OPHTHALMIC | Status: DC | PRN
  Administered 2023-06-28: 1 via OPHTHALMIC

## 2023-06-28 SURGICAL SUPPLY — 12 items
CATARACT SUITE SIGHTPATH (MISCELLANEOUS) ×1
CLOTH BEACON ORANGE TIMEOUT ST (SAFETY) ×1 IMPLANT
EYE SHIELD UNIVERSAL CLEAR (GAUZE/BANDAGES/DRESSINGS) IMPLANT
FEE CATARACT SUITE SIGHTPATH (MISCELLANEOUS) ×1 IMPLANT
GLOVE BIOGEL PI IND STRL 7.0 (GLOVE) ×2 IMPLANT
LENS IOL TECNIS EYHANCE 19.5 (Intraocular Lens) IMPLANT
NDL HYPO 18GX1.5 BLUNT FILL (NEEDLE) ×1 IMPLANT
NEEDLE HYPO 18GX1.5 BLUNT FILL (NEEDLE) ×1
PAD ARMBOARD 7.5X6 YLW CONV (MISCELLANEOUS) ×1 IMPLANT
SYR TB 1ML LL NO SAFETY (SYRINGE) ×1 IMPLANT
TAPE SURG TRANSPORE 1 IN (GAUZE/BANDAGES/DRESSINGS) IMPLANT
WATER STERILE IRR 250ML POUR (IV SOLUTION) ×1 IMPLANT

## 2023-06-28 NOTE — Interval H&P Note (Signed)
History and Physical Interval Note:  06/28/2023 9:25 AM  Eugene Harding  has presented today for surgery, with the diagnosis of combined forms age related cataract, left eye.  The various methods of treatment have been discussed with the patient and family. After consideration of risks, benefits and other options for treatment, the patient has consented to  Procedure(s) with comments: CATARACT EXTRACTION PHACO AND INTRAOCULAR LENS PLACEMENT (IOC) (Left) - pt knows to arrive at 7:30 as a surgical intervention.  The patient's history has been reviewed, patient examined, no change in status, stable for surgery.  I have reviewed the patient's chart and labs.  Questions were answered to the patient's satisfaction.     Fabio Pierce

## 2023-06-28 NOTE — Transfer of Care (Signed)
Immediate Anesthesia Transfer of Care Note  Patient: Eugene Harding  Procedure(s) Performed: CATARACT EXTRACTION PHACO AND INTRAOCULAR LENS PLACEMENT (IOC) (Left: Eye)  Patient Location: PACU and Short Stay  Anesthesia Type:MAC  Level of Consciousness: awake, alert , and oriented  Airway & Oxygen Therapy: Patient Spontanous Breathing  Post-op Assessment: Report given to RN and Post -op Vital signs reviewed and stable  Post vital signs: Reviewed and stable  Last Vitals:  Vitals Value Taken Time  BP 156/80 06/28/23 0948  Temp 36.6 C 06/28/23 0948  Pulse 86 06/28/23 0948  Resp 18 06/28/23 0948  SpO2 98 % 06/28/23 0948    Last Pain:  Vitals:   06/28/23 0948  TempSrc: Oral  PainSc: 0-No pain         Complications: No notable events documented.

## 2023-06-28 NOTE — Op Note (Signed)
Date of procedure: 06/28/23  Pre-operative diagnosis: Visually significant age-related combined cataract, Left Eye (H25.812)  Post-operative diagnosis: Visually significant age-related combined cataract, Left Eye (H25.812)  Procedure: Removal of cataract via phacoemulsification and insertion of intra-ocular lens Laural Benes and Johnson DIB00 +19.5D into the capsular bag of the Left Eye  Attending surgeon: Rudy Jew. Jerran Tappan, MD, MA  Anesthesia: MAC, Topical Akten  Complications: None  Estimated Blood Loss: <9mL (minimal)  Specimens: None  Implants: As above  Indications:  Visually significant age-related cataract, Left Eye  Procedure:  The patient was seen and identified in the pre-operative area. The operative eye was identified and dilated.  The operative eye was marked.  Topical anesthesia was administered to the operative eye.     The patient was then to the operative suite and placed in the supine position.  A timeout was performed confirming the patient, procedure to be performed, and all other relevant information.   The patient's face was prepped and draped in the usual fashion for intra-ocular surgery.  A lid speculum was placed into the operative eye and the surgical microscope moved into place and focused.  An inferotemporal paracentesis was created using a 20 gauge paracentesis blade.  Shugarcaine was injected into the anterior chamber.  Viscoelastic was injected into the anterior chamber.  A temporal clear-corneal main wound incision was created using a 2.78mm microkeratome.  A continuous curvilinear capsulorrhexis was initiated using an irrigating cystitome and completed using capsulorrhexis forceps.  Hydrodissection and hydrodeliniation were performed.  Viscoelastic was injected into the anterior chamber.  A phacoemulsification handpiece and a chopper as a second instrument were used to remove the nucleus and epinucleus. The irrigation/aspiration handpiece was used to remove any  remaining cortical material.   The capsular bag was reinflated with viscoelastic, checked, and found to be intact.  The intraocular lens was inserted into the capsular bag.  The irrigation/aspiration handpiece was used to remove any remaining viscoelastic.  The clear corneal wound and paracentesis wounds were then hydrated and checked with Weck-Cels to be watertight. 0.65mL of Moxfloxacin was injected into the anterior chamber. The lid-speculum was removed.  The drape was removed.  The patient's face was cleaned with a wet and dry 4x4.    A clear shield was taped over the eye. The patient was taken to the post-operative care unit in good condition, having tolerated the procedure well.  Post-Op Instructions: The patient will follow up at [facility] for a same day post-operative evaluation and will receive all other orders and instructions.

## 2023-06-28 NOTE — Anesthesia Preprocedure Evaluation (Signed)
Anesthesia Evaluation  Patient identified by MRN, date of birth, ID band Patient awake    Reviewed: Allergy & Precautions, H&P , NPO status , Patient's Chart, lab work & pertinent test results, reviewed documented beta blocker date and time   Airway Mallampati: II  TM Distance: >3 FB Neck ROM: full    Dental no notable dental hx.    Pulmonary neg pulmonary ROS, former smoker   Pulmonary exam normal breath sounds clear to auscultation       Cardiovascular Exercise Tolerance: Good hypertension, negative cardio ROS  Rhythm:regular Rate:Normal     Neuro/Psych negative neurological ROS  negative psych ROS   GI/Hepatic negative GI ROS, Neg liver ROS,,,  Endo/Other  negative endocrine ROSdiabetes    Renal/GU negative Renal ROS  negative genitourinary   Musculoskeletal   Abdominal   Peds  Hematology negative hematology ROS (+)   Anesthesia Other Findings   Reproductive/Obstetrics negative OB ROS                             Anesthesia Physical Anesthesia Plan  ASA: 2  Anesthesia Plan:    Post-op Pain Management:    Induction:   PONV Risk Score and Plan:   Airway Management Planned:   Additional Equipment:   Intra-op Plan:   Post-operative Plan:   Informed Consent: I have reviewed the patients History and Physical, chart, labs and discussed the procedure including the risks, benefits and alternatives for the proposed anesthesia with the patient or authorized representative who has indicated his/her understanding and acceptance.     Dental Advisory Given  Plan Discussed with: CRNA  Anesthesia Plan Comments:        Anesthesia Quick Evaluation

## 2023-06-28 NOTE — Discharge Instructions (Signed)
Please discharge patient when stable, will follow up today with Dr. June Leap at the [facility] office immediately following discharge.  Leave shield in place until visit.  All paperwork with discharge instructions will be given at the office.  [facility] Address:  [address]  [place], [place] [address]

## 2023-06-29 ENCOUNTER — Encounter (HOSPITAL_COMMUNITY): Payer: Self-pay | Admitting: Ophthalmology

## 2023-07-02 DIAGNOSIS — Z8551 Personal history of malignant neoplasm of bladder: Secondary | ICD-10-CM | POA: Diagnosis not present

## 2023-07-02 DIAGNOSIS — D4102 Neoplasm of uncertain behavior of left kidney: Secondary | ICD-10-CM | POA: Diagnosis not present

## 2023-07-03 NOTE — Anesthesia Postprocedure Evaluation (Signed)
Anesthesia Post Note  Patient: TILMAN MCCLAREN  Procedure(s) Performed: CATARACT EXTRACTION PHACO AND INTRAOCULAR LENS PLACEMENT (IOC) (Left: Eye)  Patient location during evaluation: Phase II Anesthesia Type: General Level of consciousness: awake Pain management: pain level controlled Vital Signs Assessment: post-procedure vital signs reviewed and stable Respiratory status: spontaneous breathing and respiratory function stable Cardiovascular status: blood pressure returned to baseline and stable Postop Assessment: no headache and no apparent nausea or vomiting Anesthetic complications: no Comments: Late entry   No notable events documented.   Last Vitals:  Vitals:   06/28/23 0838 06/28/23 0948  BP: (!) 146/63 (!) 156/80  Pulse: 81 86  Resp: 15 18  Temp: 36.8 C 36.6 C  SpO2: 98% 98%    Last Pain:  Vitals:   06/29/23 1100  TempSrc:   PainSc: 0-No pain                 Windell Norfolk

## 2023-07-05 ENCOUNTER — Other Ambulatory Visit: Payer: Self-pay

## 2023-07-05 ENCOUNTER — Encounter (HOSPITAL_COMMUNITY)
Admission: RE | Admit: 2023-07-05 | Discharge: 2023-07-05 | Disposition: A | Discharge status: Home / Self Care | Payer: Medicare Other | Source: Ambulatory Visit | Attending: Ophthalmology | Admitting: Ophthalmology

## 2023-07-05 DIAGNOSIS — H25811 Combined forms of age-related cataract, right eye: Secondary | ICD-10-CM | POA: Diagnosis not present

## 2023-07-05 NOTE — Pre-Procedure Instructions (Signed)
Completed PAT visit over the phone with the patient.

## 2023-07-07 NOTE — H&P (Signed)
 Surgical History & Physical  Patient Name: Eugene Harding  DOB: Apr 02, 1961  Surgery: Cataract extraction with intraocular lens implant phacoemulsification; Right Eye Surgeon: Lynwood Hermann MD Surgery Date: 07/12/2023 Pre-Op Date: 07/05/2023  HPI: A 26 Yr. old male patient present for 1 week post op OS. Patient is doing well, things are much brighter. Patient is using Moxifloxacin  TID, Ilevro qd, and Prednisolone TID OS. Also here for blurred vision in the right eye. Difficulties driving at night due to halos and car lights. Patient describes vision as blurry OD. This is negatively affecting the patient's quality of life and the patient is unable to function adequately in life with the current level of vision. Patient would like to proceed with cataract sx OD. HPI Completed by Dr. Lynwood Hermann  Medical History: Cataracts  Cancer Diabetes High Blood Pressure LDL Stroke  Review of Systems Cardiovascular High Blood Pressure Endocrine diabetes Neurological Stroke All recorded systems are negative except as noted above.  Social Former smoker   Medication Prednisolone acetate, Ilevro, Moxifloxacin ,  atorvastatin  ,  amlodipine  ,  metformin ,  neomycin -polymyxin B-dexameth ,  lisinopril  ,  glimepiride ,  meclizine ,  Jardiance ,  aspirin    Sx/Procedures Phaco c IOL OS,  Biopsy of prostate  Drug Allergies  NKDA  History & Physical: Heent: cataract NECK: supple without bruits LUNGS: lungs clear to auscultation CV: regular rate and rhythm Abdomen: soft and non-tender  Impression & Plan: Assessment: 1.  CATARACT EXTRACTION STATUS; Left Eye (Z98.42) 2.  COMBINED FORMS AGE RELATED CATARACT; Both Eyes (H25.813)  Plan: 1.  1 week after cataract surgery. Doing well with improved vision and normal eye pressure. Call with any problems or concerns. Stop Vigamox . Continue Ilevro 1 drop 1x/day for 3 more weeks. Continue Pred Acetate 1 drop 2x/day for 3 more weeks.  2.  Cataract accounts  for the patient's decreased vision. This visual impairment is not correctable with a tolerable change in glasses or contact lenses. Cataract surgery with an implantation of a new lens should significantly improve the visual and functional status of the patient. Discussed all risks, benefits, alternatives, and potential complications. Discussed the procedures and recovery. Patient desires to have surgery. A-scan ordered and performed today for intra-ocular lens calculations. The surgery will be performed in order to improve vision for driving, reading, and for eye examinations. Recommend phacoemulsification with intra-ocular lens. Recommend Dextenza  for post-operative pain and inflammation. Right Eye. Surgery required to correct imbalance of vision. Dilates well - shugarcaine by protocol.

## 2023-07-12 ENCOUNTER — Ambulatory Visit (HOSPITAL_COMMUNITY): Payer: Medicare Other | Admitting: Anesthesiology

## 2023-07-12 ENCOUNTER — Ambulatory Visit (HOSPITAL_COMMUNITY)
Admission: RE | Admit: 2023-07-12 | Discharge: 2023-07-12 | Disposition: A | Discharge status: Home / Self Care | Payer: Medicare Other | Attending: Ophthalmology | Admitting: Ophthalmology

## 2023-07-12 ENCOUNTER — Encounter (HOSPITAL_COMMUNITY)
Admission: RE | Disposition: A | Discharge status: Home / Self Care | Payer: Self-pay | Source: Home / Self Care | Attending: Ophthalmology

## 2023-07-12 DIAGNOSIS — Z9842 Cataract extraction status, left eye: Secondary | ICD-10-CM | POA: Diagnosis not present

## 2023-07-12 DIAGNOSIS — Z87891 Personal history of nicotine dependence: Secondary | ICD-10-CM | POA: Diagnosis not present

## 2023-07-12 DIAGNOSIS — Z7984 Long term (current) use of oral hypoglycemic drugs: Secondary | ICD-10-CM | POA: Diagnosis not present

## 2023-07-12 DIAGNOSIS — E1136 Type 2 diabetes mellitus with diabetic cataract: Secondary | ICD-10-CM | POA: Insufficient documentation

## 2023-07-12 DIAGNOSIS — H25811 Combined forms of age-related cataract, right eye: Secondary | ICD-10-CM | POA: Diagnosis not present

## 2023-07-12 DIAGNOSIS — H5711 Ocular pain, right eye: Secondary | ICD-10-CM | POA: Insufficient documentation

## 2023-07-12 LAB — GLUCOSE, CAPILLARY: Glucose-Capillary: 219 mg/dL — ABNORMAL HIGH (ref 70–99)

## 2023-07-12 SURGERY — CATARACT EXTRACTION PHACO AND INTRAOCULAR LENS PLACEMENT (IOC) with placement of Corticosteroid
Anesthesia: Monitor Anesthesia Care | Site: Eye | Laterality: Right

## 2023-07-12 MED ORDER — DEXAMETHASONE 0.4 MG OP INST
VAGINAL_INSERT | OPHTHALMIC | Status: AC
  Filled 2023-07-12: qty 1

## 2023-07-12 MED ORDER — POVIDONE-IODINE 5 % OP SOLN
OPHTHALMIC | Status: DC | PRN
  Administered 2023-07-12: 1 via OPHTHALMIC

## 2023-07-12 MED ORDER — TETRACAINE HCL 0.5 % OP SOLN
1.0000 [drp] | OPHTHALMIC | Status: AC | PRN
Start: 2023-07-12 — End: 2023-07-12
  Administered 2023-07-12 (×3): 1 [drp] via OPHTHALMIC

## 2023-07-12 MED ORDER — MOXIFLOXACIN HCL 5 MG/ML IO SOLN
INTRAOCULAR | Status: DC | PRN
  Administered 2023-07-12: .3 mL via INTRACAMERAL

## 2023-07-12 MED ORDER — PHENYLEPHRINE HCL 2.5 % OP SOLN
1.0000 [drp] | OPHTHALMIC | Status: AC | PRN
Start: 2023-07-12 — End: 2023-07-12
  Administered 2023-07-12 (×3): 1 [drp] via OPHTHALMIC

## 2023-07-12 MED ORDER — MIDAZOLAM HCL 2 MG/2ML IJ SOLN
INTRAMUSCULAR | Status: AC
  Filled 2023-07-12: qty 2

## 2023-07-12 MED ORDER — SODIUM HYALURONATE 10 MG/ML IO SOLUTION
PREFILLED_SYRINGE | INTRAOCULAR | Status: DC | PRN
  Administered 2023-07-12: .85 mL via INTRAOCULAR

## 2023-07-12 MED ORDER — LIDOCAINE HCL 3.5 % OP GEL
1.0000 | Freq: Once | OPHTHALMIC | Status: AC
  Administered 2023-07-12: 1 via OPHTHALMIC

## 2023-07-12 MED ORDER — STERILE WATER FOR IRRIGATION IR SOLN
Status: DC | PRN
  Administered 2023-07-12: 25 mL

## 2023-07-12 MED ORDER — MIDAZOLAM HCL 2 MG/2ML IJ SOLN
INTRAMUSCULAR | Status: DC | PRN
  Administered 2023-07-12: .5 mg via INTRAVENOUS
  Administered 2023-07-12: 1.5 mg via INTRAVENOUS

## 2023-07-12 MED ORDER — LACTATED RINGERS IV SOLN: INTRAVENOUS | Status: DC

## 2023-07-12 MED ORDER — LIDOCAINE HCL (PF) 1 % IJ SOLN
INTRAOCULAR | Status: DC | PRN
  Administered 2023-07-12: 1 mL via OPHTHALMIC

## 2023-07-12 MED ORDER — TROPICAMIDE 1 % OP SOLN
1.0000 [drp] | OPHTHALMIC | Status: AC | PRN
  Administered 2023-07-12 (×3): 1 [drp] via OPHTHALMIC

## 2023-07-12 MED ORDER — EPINEPHRINE PF 1 MG/ML IJ SOLN
INTRAOCULAR | Status: DC | PRN
  Administered 2023-07-12: 500 mL

## 2023-07-12 MED ORDER — SODIUM CHLORIDE 0.9% FLUSH
INTRAVENOUS | Status: DC | PRN
  Administered 2023-07-12 (×2): 3 mL via INTRAVENOUS

## 2023-07-12 MED ORDER — DEXAMETHASONE 0.4 MG OP INST
VAGINAL_INSERT | OPHTHALMIC | Status: DC | PRN
  Administered 2023-07-12: .4 mg via OPHTHALMIC

## 2023-07-12 MED ORDER — BSS IO SOLN
INTRAOCULAR | Status: DC | PRN
  Administered 2023-07-12: 15 mL via INTRAOCULAR

## 2023-07-12 MED ORDER — SODIUM HYALURONATE 23MG/ML IO SOSY
PREFILLED_SYRINGE | INTRAOCULAR | Status: DC | PRN
  Administered 2023-07-12: .6 mL via INTRAOCULAR

## 2023-07-12 SURGICAL SUPPLY — 13 items
CATARACT SUITE SIGHTPATH (MISCELLANEOUS) ×2
CLOTH BEACON ORANGE TIMEOUT ST (SAFETY) ×2 IMPLANT
EYE SHIELD UNIVERSAL CLEAR (GAUZE/BANDAGES/DRESSINGS) ×1 IMPLANT
FEE CATARACT SUITE SIGHTPATH (MISCELLANEOUS) ×1 IMPLANT
GLOVE BIOGEL PI IND STRL 6.5 (GLOVE) ×1 IMPLANT
GLOVE BIOGEL PI IND STRL 7.0 (GLOVE) ×3 IMPLANT
LENS IOL TECNIS EYHANCE 20.0 (Intraocular Lens) ×1 IMPLANT
NDL HYPO 18GX1.5 BLUNT FILL (NEEDLE) ×1 IMPLANT
NEEDLE HYPO 18GX1.5 BLUNT FILL (NEEDLE) ×2
PAD ARMBOARD 7.5X6 YLW CONV (MISCELLANEOUS) ×2 IMPLANT
SYR TB 1ML LL NO SAFETY (SYRINGE) ×2 IMPLANT
TAPE SURG TRANSPORE 1 IN (GAUZE/BANDAGES/DRESSINGS) ×1 IMPLANT
WATER STERILE IRR 250ML POUR (IV SOLUTION) ×2 IMPLANT

## 2023-07-12 NOTE — Anesthesia Postprocedure Evaluation (Signed)
 Anesthesia Post Note  Patient: Eugene Harding  Procedure(s) Performed: CATARACT EXTRACTION PHACO AND INTRAOCULAR LENS PLACEMENT (IOC) with placement of Corticosteroid (Right: Eye)  Patient location during evaluation: Phase II Anesthesia Type: General Level of consciousness: awake Pain management: pain level controlled Vital Signs Assessment: post-procedure vital signs reviewed and stable Respiratory status: spontaneous breathing and respiratory function stable Cardiovascular status: blood pressure returned to baseline and stable Postop Assessment: no headache and no apparent nausea or vomiting Anesthetic complications: no Comments: Late entry   No notable events documented.   Last Vitals:  Vitals:   07/12/23 0715  BP: (!) 149/66  Pulse: 72  Resp: 18  Temp: 36.8 C  SpO2: 99%    Last Pain:  Vitals:   07/12/23 0715  TempSrc: Oral  PainSc: 0-No pain                 Coretha Dew

## 2023-07-12 NOTE — Op Note (Addendum)
 Date of procedure: 07/12/23  Pre-operative diagnosis:  Visually significant combined form age-related cataract, Right Eye (H25.811)  Post-operative diagnosis:   1. Visually significant combined form age-related cataract, Right Eye (H25.811) 2. Pain and inflammation following cataract surgery Right Eye (H57.11)  Procedure:  Removal of cataract via phacoemulsification and insertion of intra-ocular lens Johnson and Johnson DIB00 +20.0D into the capsular bag of the Right Eye 2. Placement of Dextenza  insert, Right Eye  Attending surgeon: Pleas Brill. Angeles Zehner, MD, MA  Anesthesia: MAC, Topical Akten   Complications: None  Estimated Blood Loss: <8mL (minimal)  Specimens: None  Implants: As above  Indications:  Visually significant age-related cataract, Right Eye  Procedure:  The patient was seen and identified in the pre-operative area. The operative eye was identified and dilated.  The operative eye was marked.  Topical anesthesia was administered to the operative eye.     The patient was then to the operative suite and placed in the supine position.  A timeout was performed confirming the patient, procedure to be performed, and all other relevant information.   The patient's face was prepped and draped in the usual fashion for intra-ocular surgery.  A lid speculum was placed into the operative eye and the surgical microscope moved into place and focused.  A superotemporal paracentesis was created using a 20 gauge paracentesis blade.  Shugarcaine was injected into the anterior chamber.  Viscoelastic was injected into the anterior chamber.  A temporal clear-corneal main wound incision was created using a 2.71mm microkeratome.  A continuous curvilinear capsulorrhexis was initiated using an irrigating cystitome and completed using capsulorrhexis forceps.  Hydrodissection and hydrodeliniation were performed.  Viscoelastic was injected into the anterior chamber.  A phacoemulsification handpiece and a  chopper as a second instrument were used to remove the nucleus and epinucleus. The irrigation/aspiration handpiece was used to remove any remaining cortical material.   The capsular bag was reinflated with viscoelastic, checked, and found to be intact.  The intraocular lens was inserted into the capsular bag.  The irrigation/aspiration handpiece was used to remove any remaining viscoelastic.  The clear corneal wound and paracentesis wounds were then hydrated and checked with Weck-Cels to be watertight. 0.1mL of moxifloxacin  was injected into the anterior chamber.  The lid-speculum was removed. The upper punctum was dilated. A Dextenza  implant was placed in the upper canaliculus without complication.  The drape was removed.  The patient's face was cleaned with a wet and dry 4x4. A clear shield was taped over the eye. The patient was taken to the post-operative care unit in good condition, having tolerated the procedure well.  Post-Op Instructions: The patient will follow up at [facility] for a same day post-operative evaluation and will receive all other orders and instructions.

## 2023-07-12 NOTE — Interval H&P Note (Signed)
 History and Physical Interval Note:  07/12/2023 8:23 AM  Eugene Harding  has presented today for surgery, with the diagnosis of combined forms age related cataract, right eye.  The various methods of treatment have been discussed with the patient and family. After consideration of risks, benefits and other options for treatment, the patient has consented to  Procedure(s): CATARACT EXTRACTION PHACO AND INTRAOCULAR LENS PLACEMENT (IOC) with placement of Corticosteroid as a surgical intervention.  The patient's history has been reviewed, patient examined, no change in status, stable for surgery.  I have reviewed the patient's chart and labs.  Questions were answered to the patient's satisfaction.     Tarri Farm

## 2023-07-12 NOTE — Discharge Instructions (Addendum)
 Please discharge patient when stable, will follow up today with Dr. June Leap at the [facility] office immediately following discharge.  Leave shield in place until visit.  All paperwork with discharge instructions will be given at the office.  [facility] Address:  [address]  [place], [place] [address]

## 2023-07-12 NOTE — Anesthesia Preprocedure Evaluation (Signed)
 Anesthesia Evaluation  Patient identified by MRN, date of birth, ID band Patient awake    Reviewed: Allergy & Precautions, H&P , NPO status , Patient's Chart, lab work & pertinent test results, reviewed documented beta blocker date and time   Airway Mallampati: II  TM Distance: >3 FB Neck ROM: full    Dental no notable dental hx.    Pulmonary neg pulmonary ROS, former smoker   Pulmonary exam normal breath sounds clear to auscultation       Cardiovascular Exercise Tolerance: Good hypertension, negative cardio ROS  Rhythm:regular Rate:Normal     Neuro/Psych negative neurological ROS  negative psych ROS   GI/Hepatic negative GI ROS, Neg liver ROS,,,  Endo/Other  negative endocrine ROSdiabetes    Renal/GU negative Renal ROS  negative genitourinary   Musculoskeletal   Abdominal   Peds  Hematology negative hematology ROS (+)   Anesthesia Other Findings   Reproductive/Obstetrics negative OB ROS                             Anesthesia Physical Anesthesia Plan  ASA: 2  Anesthesia Plan:    Post-op Pain Management:    Induction:   PONV Risk Score and Plan:   Airway Management Planned:   Additional Equipment:   Intra-op Plan:   Post-operative Plan:   Informed Consent: I have reviewed the patients History and Physical, chart, labs and discussed the procedure including the risks, benefits and alternatives for the proposed anesthesia with the patient or authorized representative who has indicated his/her understanding and acceptance.     Dental Advisory Given  Plan Discussed with: CRNA  Anesthesia Plan Comments:        Anesthesia Quick Evaluation

## 2023-07-12 NOTE — Transfer of Care (Addendum)
 Immediate Anesthesia Transfer of Care Note  Patient: Eugene Harding  Procedure(s) Performed: CATARACT EXTRACTION PHACO AND INTRAOCULAR LENS PLACEMENT (IOC) with placement of Corticosteroid (Right: Eye)  Patient Location: Short Stay  Anesthesia Type:MAC  Level of Consciousness: awake and patient cooperative  Airway & Oxygen Therapy: Patient Spontanous Breathing  Post-op Assessment: Report given to RN and Post -op Vital signs reviewed and stable  Post vital signs: Reviewed and stable  Last Vitals:  Vitals Value Taken Time  BP 149/66 07/12/23 @ 0715  Temp 36.8 07/12/23 @ 0715  Pulse 72 07/12/23 @ 0715  Resp 18 07/12/23 @ 0715  SpO2 99% 07/12/23 @ 0715    Last Pain:  Vitals:   07/12/23 0715  TempSrc: Oral  PainSc: 0-No pain      Patients Stated Pain Goal: 6 (07/12/23 0715)  Complications: No notable events documented.

## 2023-08-10 DIAGNOSIS — Z0000 Encounter for general adult medical examination without abnormal findings: Secondary | ICD-10-CM | POA: Diagnosis not present

## 2023-08-10 DIAGNOSIS — Z7189 Other specified counseling: Secondary | ICD-10-CM | POA: Diagnosis not present

## 2023-08-10 DIAGNOSIS — I10 Essential (primary) hypertension: Secondary | ICD-10-CM | POA: Diagnosis not present

## 2023-08-10 DIAGNOSIS — Z299 Encounter for prophylactic measures, unspecified: Secondary | ICD-10-CM | POA: Diagnosis not present

## 2023-08-10 DIAGNOSIS — I700 Atherosclerosis of aorta: Secondary | ICD-10-CM | POA: Diagnosis not present

## 2023-08-10 DIAGNOSIS — Z1389 Encounter for screening for other disorder: Secondary | ICD-10-CM | POA: Diagnosis not present

## 2023-09-22 DIAGNOSIS — I69359 Hemiplegia and hemiparesis following cerebral infarction affecting unspecified side: Secondary | ICD-10-CM | POA: Diagnosis not present

## 2023-09-22 DIAGNOSIS — Z299 Encounter for prophylactic measures, unspecified: Secondary | ICD-10-CM | POA: Diagnosis not present

## 2023-09-22 DIAGNOSIS — E1169 Type 2 diabetes mellitus with other specified complication: Secondary | ICD-10-CM | POA: Diagnosis not present

## 2023-09-22 DIAGNOSIS — I10 Essential (primary) hypertension: Secondary | ICD-10-CM | POA: Diagnosis not present

## 2023-11-03 DIAGNOSIS — I10 Essential (primary) hypertension: Secondary | ICD-10-CM | POA: Diagnosis not present

## 2023-11-03 DIAGNOSIS — Z0000 Encounter for general adult medical examination without abnormal findings: Secondary | ICD-10-CM | POA: Diagnosis not present

## 2023-11-03 DIAGNOSIS — R5383 Other fatigue: Secondary | ICD-10-CM | POA: Diagnosis not present

## 2023-11-03 DIAGNOSIS — Z79899 Other long term (current) drug therapy: Secondary | ICD-10-CM | POA: Diagnosis not present

## 2023-11-03 DIAGNOSIS — Z299 Encounter for prophylactic measures, unspecified: Secondary | ICD-10-CM | POA: Diagnosis not present

## 2023-11-03 DIAGNOSIS — E7800 Pure hypercholesterolemia, unspecified: Secondary | ICD-10-CM | POA: Diagnosis not present

## 2023-11-30 DIAGNOSIS — D4102 Neoplasm of uncertain behavior of left kidney: Secondary | ICD-10-CM | POA: Diagnosis not present

## 2023-11-30 DIAGNOSIS — Z8551 Personal history of malignant neoplasm of bladder: Secondary | ICD-10-CM | POA: Diagnosis not present

## 2024-02-07 DIAGNOSIS — I10 Essential (primary) hypertension: Secondary | ICD-10-CM | POA: Diagnosis not present

## 2024-02-07 DIAGNOSIS — E119 Type 2 diabetes mellitus without complications: Secondary | ICD-10-CM | POA: Diagnosis not present

## 2024-02-07 DIAGNOSIS — C642 Malignant neoplasm of left kidney, except renal pelvis: Secondary | ICD-10-CM | POA: Diagnosis not present

## 2024-02-07 DIAGNOSIS — Z299 Encounter for prophylactic measures, unspecified: Secondary | ICD-10-CM | POA: Diagnosis not present

## 2024-03-15 DIAGNOSIS — Z961 Presence of intraocular lens: Secondary | ICD-10-CM | POA: Diagnosis not present

## 2024-03-15 DIAGNOSIS — I69998 Other sequelae following unspecified cerebrovascular disease: Secondary | ICD-10-CM | POA: Diagnosis not present

## 2024-03-15 DIAGNOSIS — H401133 Primary open-angle glaucoma, bilateral, severe stage: Secondary | ICD-10-CM | POA: Diagnosis not present
